# Patient Record
Sex: Male | Born: 1970 | Race: White | Hispanic: No | Marital: Married | Smoking: Current every day smoker
Health system: Southern US, Community
[De-identification: ages and names within clinical notes are randomized; demographics above are authoritative.]

## PROBLEM LIST (undated history)

## (undated) DIAGNOSIS — I10 Essential (primary) hypertension: Secondary | ICD-10-CM

## (undated) DIAGNOSIS — F329 Major depressive disorder, single episode, unspecified: Secondary | ICD-10-CM

## (undated) DIAGNOSIS — E785 Hyperlipidemia, unspecified: Secondary | ICD-10-CM

## (undated) DIAGNOSIS — F32A Depression, unspecified: Secondary | ICD-10-CM

---

## 2013-01-09 ENCOUNTER — Emergency Department (INDEPENDENT_AMBULATORY_CARE_PROVIDER_SITE_OTHER)
Admission: EM | Admit: 2013-01-09 | Discharge: 2013-01-09 | Disposition: A | Discharge status: Home / Self Care | Payer: BC Managed Care – PPO | Source: Home / Self Care | Attending: Family Medicine | Admitting: Family Medicine

## 2013-01-09 ENCOUNTER — Emergency Department (INDEPENDENT_AMBULATORY_CARE_PROVIDER_SITE_OTHER): Payer: BC Managed Care – PPO

## 2013-01-09 ENCOUNTER — Encounter: Payer: Self-pay | Admitting: *Deleted

## 2013-01-09 DIAGNOSIS — R209 Unspecified disturbances of skin sensation: Secondary | ICD-10-CM

## 2013-01-09 MED ORDER — MELOXICAM 15 MG PO TABS: 15.0000 mg | ORAL_TABLET | Freq: Every day | ORAL | Status: DC
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
## 2013-01-19 ENCOUNTER — Encounter: Payer: Self-pay | Admitting: Sports Medicine

## 2013-01-19 ENCOUNTER — Ambulatory Visit (INDEPENDENT_AMBULATORY_CARE_PROVIDER_SITE_OTHER): Payer: BC Managed Care – PPO | Admitting: Sports Medicine

## 2013-01-19 VITALS — BP 168/93 | HR 107 | Wt 170.0 lb

## 2013-01-19 DIAGNOSIS — M5030 Other cervical disc degeneration, unspecified cervical region: Secondary | ICD-10-CM | POA: Insufficient documentation

## 2013-01-19 DIAGNOSIS — S139XXA Sprain of joints and ligaments of unspecified parts of neck, initial encounter: Secondary | ICD-10-CM

## 2013-01-19 DIAGNOSIS — S134XXA Sprain of ligaments of cervical spine, initial encounter: Secondary | ICD-10-CM | POA: Insufficient documentation

## 2013-01-19 MED ORDER — CYCLOBENZAPRINE HCL 10 MG PO TABS: ORAL_TABLET | ORAL | Status: DC

## 2013-01-19 MED ORDER — PREDNISONE 50 MG PO TABS: ORAL_TABLET | ORAL | Status: DC
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
## 2013-01-19 NOTE — Assessment & Plan Note (Signed)
Symptoms are highly classic for left-sided C6 versus a C7 radiculitis. I would like to start conservatively with physical therapy, prednisone, Flexeril, he may use ibuprofen as needed. I would like to see him back in 4 weeks if no better we can certainly get an MRI of the cervical spine.

## 2013-02-02 ENCOUNTER — Ambulatory Visit (INDEPENDENT_AMBULATORY_CARE_PROVIDER_SITE_OTHER): Payer: BC Managed Care – PPO | Admitting: Physical Therapy

## 2013-02-02 DIAGNOSIS — S139XXA Sprain of joints and ligaments of unspecified parts of neck, initial encounter: Secondary | ICD-10-CM

## 2013-02-02 DIAGNOSIS — M542 Cervicalgia: Secondary | ICD-10-CM

## 2013-02-02 DIAGNOSIS — R293 Abnormal posture: Secondary | ICD-10-CM

## 2013-02-03 ENCOUNTER — Ambulatory Visit (INDEPENDENT_AMBULATORY_CARE_PROVIDER_SITE_OTHER): Payer: BC Managed Care – PPO | Admitting: Sports Medicine

## 2013-02-03 ENCOUNTER — Encounter: Payer: Self-pay | Admitting: Sports Medicine

## 2013-02-03 VITALS — BP 170/97 | HR 98 | Wt 177.0 lb

## 2013-02-03 DIAGNOSIS — M5030 Other cervical disc degeneration, unspecified cervical region: Secondary | ICD-10-CM

## 2013-02-03 MED ORDER — PREDNISONE 10 MG PO TABS: ORAL_TABLET | ORAL | Status: DC
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
## 2013-02-03 NOTE — Assessment & Plan Note (Signed)
Was doing much better, but had a flare. C7 radiculitis. I think we will pursue further conservative measures with additional pred taper pack Continue PT. Return middle of April. 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

## 2013-02-06 ENCOUNTER — Encounter (INDEPENDENT_AMBULATORY_CARE_PROVIDER_SITE_OTHER): Payer: BC Managed Care – PPO | Admitting: Physical Therapy

## 2013-02-06 DIAGNOSIS — R293 Abnormal posture: Secondary | ICD-10-CM

## 2013-02-06 DIAGNOSIS — M542 Cervicalgia: Secondary | ICD-10-CM

## 2013-02-06 DIAGNOSIS — S139XXA Sprain of joints and ligaments of unspecified parts of neck, initial encounter: Secondary | ICD-10-CM

## 2013-02-09 ENCOUNTER — Encounter (INDEPENDENT_AMBULATORY_CARE_PROVIDER_SITE_OTHER): Payer: BC Managed Care – PPO | Admitting: Physical Therapy

## 2013-02-09 DIAGNOSIS — S139XXA Sprain of joints and ligaments of unspecified parts of neck, initial encounter: Secondary | ICD-10-CM

## 2013-02-09 DIAGNOSIS — M542 Cervicalgia: Secondary | ICD-10-CM

## 2013-02-09 DIAGNOSIS — R293 Abnormal posture: Secondary | ICD-10-CM

## 2013-02-13 ENCOUNTER — Encounter: Payer: BC Managed Care – PPO | Admitting: Physical Therapy

## 2013-02-16 ENCOUNTER — Ambulatory Visit: Payer: BC Managed Care – PPO | Admitting: Sports Medicine

## 2013-02-16 ENCOUNTER — Encounter (INDEPENDENT_AMBULATORY_CARE_PROVIDER_SITE_OTHER): Payer: BC Managed Care – PPO | Admitting: Physical Therapy

## 2013-02-16 DIAGNOSIS — R293 Abnormal posture: Secondary | ICD-10-CM

## 2013-02-16 DIAGNOSIS — M542 Cervicalgia: Secondary | ICD-10-CM

## 2013-02-16 DIAGNOSIS — S139XXA Sprain of joints and ligaments of unspecified parts of neck, initial encounter: Secondary | ICD-10-CM

## 2013-02-27 ENCOUNTER — Ambulatory Visit (INDEPENDENT_AMBULATORY_CARE_PROVIDER_SITE_OTHER): Payer: BC Managed Care – PPO | Admitting: Sports Medicine

## 2013-02-27 ENCOUNTER — Encounter: Payer: Self-pay | Admitting: Sports Medicine

## 2013-02-27 VITALS — BP 160/99 | HR 94 | Wt 168.0 lb

## 2013-02-27 DIAGNOSIS — M5030 Other cervical disc degeneration, unspecified cervical region: Secondary | ICD-10-CM

## 2013-02-27 MED ORDER — ORPHENADRINE CITRATE ER 100 MG PO TB12: 100.0000 mg | ORAL_TABLET | Freq: Two times a day (BID) | ORAL | Status: DC

## 2013-02-27 NOTE — Progress Notes (Signed)
   Subjective:    CC: Followup  HPI: C7 cervical radiculitis: Kyllian comes back for followup his pain. I placed him on steroids, NSAIDs, muscle relaxers, and placed him in formal physical therapy. His only a little bit better, and the Flexeril is too strong. Pain continues to come down his left arm in a C7 distribution, however he also has some pain now between his shoulder blades. Symptoms are moderate, and stable.  Past medical history, Surgical history, Family history not pertinant except as noted below, Social history, Allergies, and medications have been entered into the medical record, reviewed, and no changes needed.   Review of Systems: No headache, visual changes, nausea, vomiting, diarrhea, constipation, dizziness, abdominal pain, skin rash, fevers, chills, night sweats, weight loss, swollen lymph nodes, body aches, joint swelling, muscle aches, chest pain, shortness of breath, mood changes, visual or auditory hallucinations.   Objective:   General: Well Developed, well nourished, and in no acute distress.  Neuro/Psych: Alert and oriented x3, extra-ocular muscles intact, able to move all 4 extremities, sensation grossly intact. Skin: Warm and dry, no rashes noted.  Respiratory: Not using accessory muscles, speaking in full sentences, trachea midline.  Cardiovascular: Pulses palpable, no extremity edema. Abdomen: Does not appear distended. Neck: Inspection unremarkable. No palpable stepoffs. Negative Spurling's maneuver. Full neck range of motion Grip strength and sensation normal in bilateral hands Strength good C4 to T1 distribution No sensory change to C4 to T1 Negative Hoffman sign bilaterally Reflexes normal Impression and Recommendations:   This case required medical decision making of moderate complexity.

## 2013-02-27 NOTE — Assessment & Plan Note (Signed)
Status post physical therapy, he does have a C7 radiculitis. Pain is now predominantly between the shoulder blades. Status post oral steroids and NSAIDs. Flexeril was too strong, changing to Norflex. MRI of the cervical spine, and he will come back to see me to go over results.

## 2013-03-02 ENCOUNTER — Telehealth: Payer: Self-pay | Admitting: *Deleted

## 2013-03-02 NOTE — Telephone Encounter (Signed)
Spoke with [facility] and obtained prior auth for MRI Cervical spine w/o contrast.  Auth# [id number] good til 03/31/2013. Spoke with Carollee Herter at [facility] in [place] and gave authorization #. Barry Dienes, LPN

## 2013-03-03 ENCOUNTER — Ambulatory Visit (HOSPITAL_BASED_OUTPATIENT_CLINIC_OR_DEPARTMENT_OTHER)
Admission: RE | Admit: 2013-03-03 | Discharge: 2013-03-03 | Disposition: A | Discharge status: Home / Self Care | Payer: BC Managed Care – PPO | Source: Ambulatory Visit | Attending: Sports Medicine | Admitting: Sports Medicine

## 2013-03-03 ENCOUNTER — Ambulatory Visit (HOSPITAL_BASED_OUTPATIENT_CLINIC_OR_DEPARTMENT_OTHER): Payer: BC Managed Care – PPO

## 2013-03-03 DIAGNOSIS — R29898 Other symptoms and signs involving the musculoskeletal system: Secondary | ICD-10-CM | POA: Insufficient documentation

## 2013-03-03 DIAGNOSIS — M5380 Other specified dorsopathies, site unspecified: Secondary | ICD-10-CM | POA: Insufficient documentation

## 2013-03-03 DIAGNOSIS — M542 Cervicalgia: Secondary | ICD-10-CM | POA: Insufficient documentation

## 2013-03-03 DIAGNOSIS — M5030 Other cervical disc degeneration, unspecified cervical region: Secondary | ICD-10-CM

## 2013-03-03 DIAGNOSIS — R209 Unspecified disturbances of skin sensation: Secondary | ICD-10-CM | POA: Insufficient documentation

## 2013-03-05 ENCOUNTER — Ambulatory Visit (INDEPENDENT_AMBULATORY_CARE_PROVIDER_SITE_OTHER): Payer: BC Managed Care – PPO | Admitting: Sports Medicine

## 2013-03-05 VITALS — BP 154/91 | HR 115 | Temp 98.6°F | Resp 16 | Wt 170.0 lb

## 2013-03-05 DIAGNOSIS — S134XXD Sprain of ligaments of cervical spine, subsequent encounter: Secondary | ICD-10-CM

## 2013-03-05 DIAGNOSIS — M5030 Other cervical disc degeneration, unspecified cervical region: Secondary | ICD-10-CM
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
## 2013-03-05 NOTE — Assessment & Plan Note (Signed)
On my review of the MRI there is multilevel degenerative disc disease, worse at the C4-C5 level with a large right-sided disc protrusion. There is also degenerative disease at the C5-C6 level with a broad-based disc protrusion causing bilateral foraminal stenosis. Lastly, there is disc protrusion at the C6-C7 level, broad-based causing bilateral foraminal stenosis, C7-T1 level is clear. Marcus Lee has already been through physical therapy, steroids, muscle relaxers, NSAIDs. At this point we are going to proceed with interventional treatment, it will be a C6-C7 left-sided interlaminar epidural steroid injection. He'll come back to see me after the injection to go over response.
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
## 2013-03-26 ENCOUNTER — Ambulatory Visit
Admission: RE | Admit: 2013-03-26 | Discharge: 2013-03-26 | Disposition: A | Discharge status: Home / Self Care | Payer: BC Managed Care – PPO | Source: Ambulatory Visit | Attending: Sports Medicine | Admitting: Sports Medicine

## 2013-03-26 VITALS — BP 166/90 | HR 90

## 2013-03-26 DIAGNOSIS — M5030 Other cervical disc degeneration, unspecified cervical region: Secondary | ICD-10-CM

## 2013-03-26 MED ORDER — TRIAMCINOLONE ACETONIDE 40 MG/ML IJ SUSP (RADIOLOGY)
60.0000 mg | Freq: Once | INTRAMUSCULAR | Status: AC
  Administered 2013-03-26: 60 mg via EPIDURAL

## 2013-03-26 MED ORDER — IOHEXOL 300 MG/ML  SOLN
1.0000 mL | Freq: Once | INTRAMUSCULAR | Status: AC | PRN
  Administered 2013-03-26: 1 mL via EPIDURAL

## 2013-04-02 ENCOUNTER — Ambulatory Visit (INDEPENDENT_AMBULATORY_CARE_PROVIDER_SITE_OTHER): Payer: BC Managed Care – PPO | Admitting: Sports Medicine

## 2013-04-02 VITALS — BP 157/102 | HR 101

## 2013-04-02 DIAGNOSIS — I10 Essential (primary) hypertension: Secondary | ICD-10-CM

## 2013-04-02 DIAGNOSIS — Z299 Encounter for prophylactic measures, unspecified: Secondary | ICD-10-CM | POA: Insufficient documentation

## 2013-04-02 DIAGNOSIS — M5030 Other cervical disc degeneration, unspecified cervical region: Secondary | ICD-10-CM

## 2013-04-02 MED ORDER — LISINOPRIL-HYDROCHLOROTHIAZIDE 10-12.5 MG PO TABS: 1.0000 | ORAL_TABLET | Freq: Every day | ORAL | Status: DC

## 2013-04-02 NOTE — Assessment & Plan Note (Signed)
Lisinopril/hydrochlorothiazide 12.5/20. Return in 2 weeks for blood pressure check.

## 2013-04-02 NOTE — Assessment & Plan Note (Signed)
Checking routine bloodwork. 

## 2013-04-02 NOTE — Progress Notes (Signed)
  Subjective:    CC: Followup  HPI: C7 radiculitis: Ameya had his interlaminar epidural injection, returns today more than 90% improved, and a single ibuprofen results his pain completely. He is very happy with the results.  Elevated blood pressure: Does not have a history of hypertension, but has been told he has high blood pressure and several occasions, all of his family members have hypertension. Denies visual changes, headaches, chest pain, short of breath, lower extremity swelling, desires to start a medication rather than do lifestyle modification.  Past medical history, Surgical history, Family history not pertinant except as noted below, Social history, Allergies, and medications have been entered into the medical record, reviewed, and no changes needed.   Review of Systems: No fevers, chills, night sweats, weight loss, chest pain, or shortness of breath.   Objective:    General: Well Developed, well nourished, and in no acute distress.  Neuro: Alert and oriented x3, extra-ocular muscles intact, sensation grossly intact.  HEENT: Normocephalic, atraumatic, pupils equal round reactive to light, neck supple, no masses, no lymphadenopathy, thyroid nonpalpable.  Skin: Warm and dry, no rashes. Cardiac: Regular rate and rhythm, no murmurs rubs or gallops, no lower extremity edema.  Respiratory: Clear to auscultation bilaterally. Not using accessory muscles, speaking in full sentences. Neck: Inspection unremarkable. No palpable stepoffs. Negative Spurling's maneuver. Full neck range of motion Grip strength and sensation normal in bilateral hands Strength good C4 to T1 distribution No sensory change to C4 to T1 Negative Hoffman sign bilaterally Reflexes normal Impression and Recommendations:

## 2013-04-02 NOTE — Assessment & Plan Note (Signed)
90+ percent pain relief with a single interlaminar epidural steroid injection for left C7 radiculitis. Continue home exercises, and occasional Mobic. Return as needed for this. Review of 3 injections in  a six-month period.

## 2013-04-07 LAB — TSH: TSH: 1.687 u[IU]/mL (ref 0.350–4.500)

## 2013-04-07 LAB — COMPREHENSIVE METABOLIC PANEL WITH GFR
ALT: 19 U/L (ref 0–53)
BUN: 20 mg/dL (ref 6–23)
CO2: 25 meq/L (ref 19–32)
Calcium: 10.3 mg/dL (ref 8.4–10.5)
Creat: 0.72 mg/dL (ref 0.50–1.35)
Glucose, Bld: 91 mg/dL (ref 70–99)
Total Bilirubin: 0.6 mg/dL (ref 0.3–1.2)

## 2013-04-07 LAB — CBC
HCT: 41 % (ref 39.0–52.0)
Hemoglobin: 14.5 g/dL (ref 13.0–17.0)
MCH: 33.6 pg (ref 26.0–34.0)
MCHC: 35.4 g/dL (ref 30.0–36.0)
MCV: 94.9 fL (ref 78.0–100.0)
Platelets: 303 K/uL (ref 150–400)
RBC: 4.32 MIL/uL (ref 4.22–5.81)
RDW: 13.6 % (ref 11.5–15.5)
WBC: 6.9 10*3/uL (ref 4.0–10.5)

## 2013-04-07 LAB — LIPID PANEL
Cholesterol: 259 mg/dL — ABNORMAL HIGH (ref 0–200)
HDL: 80 mg/dL (ref >39)
LDL Cholesterol: 166 mg/dL — ABNORMAL HIGH (ref 0–99)
Total CHOL/HDL Ratio: 3.2 ratio
Triglycerides: 65 mg/dL (ref <150)
VLDL: 13 mg/dL (ref 0–40)

## 2013-04-07 LAB — COMPREHENSIVE METABOLIC PANEL
AST: 17 U/L (ref 0–37)
Albumin: 5.1 g/dL (ref 3.5–5.2)
Alkaline Phosphatase: 62 U/L (ref 39–117)
Chloride: 96 mEq/L (ref 96–112)
Potassium: 4.3 mEq/L (ref 3.5–5.3)
Sodium: 134 mEq/L — ABNORMAL LOW (ref 135–145)
Total Protein: 7.5 g/dL (ref 6.0–8.3)

## 2013-04-08 ENCOUNTER — Encounter: Payer: Self-pay | Admitting: Sports Medicine

## 2013-04-08 DIAGNOSIS — E291 Testicular hypofunction: Secondary | ICD-10-CM | POA: Insufficient documentation

## 2013-04-08 DIAGNOSIS — E785 Hyperlipidemia, unspecified: Secondary | ICD-10-CM | POA: Insufficient documentation

## 2013-04-08 LAB — TESTOSTERONE, FREE, TOTAL, SHBG
Sex Hormone Binding: 34 nmol/L (ref 13–71)
Testosterone, Free: 34.9 pg/mL — ABNORMAL LOW (ref 47.0–244.0)
Testosterone-% Free: 1.9 % (ref 1.6–2.9)
Testosterone: 188 ng/dL — ABNORMAL LOW (ref 300–890)

## 2013-04-08 LAB — VITAMIN D 25 HYDROXY (VIT D DEFICIENCY, FRACTURES): Vit D, 25-Hydroxy: 63 ng/mL (ref 30–89)

## 2013-04-09 MED ORDER — ATORVASTATIN CALCIUM 40 MG PO TABS: 40.0000 mg | ORAL_TABLET | Freq: Every day | ORAL | Status: DC

## 2013-04-09 NOTE — Addendum Note (Signed)
Addended by: Monica Becton on: 04/09/2013 09:37 AM   Modules accepted: Orders

## 2013-04-15 ENCOUNTER — Ambulatory Visit (INDEPENDENT_AMBULATORY_CARE_PROVIDER_SITE_OTHER): Payer: BC Managed Care – PPO | Admitting: Sports Medicine

## 2013-04-15 VITALS — BP 131/85 | HR 108

## 2013-04-15 DIAGNOSIS — I10 Essential (primary) hypertension: Secondary | ICD-10-CM

## 2013-04-15 DIAGNOSIS — E291 Testicular hypofunction: Secondary | ICD-10-CM

## 2013-04-15 DIAGNOSIS — M5030 Other cervical disc degeneration, unspecified cervical region: Secondary | ICD-10-CM

## 2013-04-15 MED ORDER — LISINOPRIL-HYDROCHLOROTHIAZIDE 20-25 MG PO TABS: 1.0000 | ORAL_TABLET | Freq: Every day | ORAL | Status: DC

## 2013-04-15 MED ORDER — TESTOSTERONE 40.5 MG/2.5GM (1.62%) TD GEL: 1.0000 "application " | Freq: Every day | TRANSDERMAL | Status: DC

## 2013-04-15 NOTE — Assessment & Plan Note (Signed)
Starting testosterone supplementation. Recheck in one month.

## 2013-04-15 NOTE — Assessment & Plan Note (Signed)
Improved, increasing blood pressure medicine to 20/25 mg.

## 2013-04-15 NOTE — Progress Notes (Signed)
  Subjective:    CC: Followup  HPI: Hypertension: Better controlled with lisinopril/hydrochlorothiazide.  Hyperlipidemia: No problems with statin.  Cervical degenerative disc disease: Continue to be pain-free after single epidural injection.  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 Like to try supplementation.  Past medical history, Surgical history, Family history not pertinant except as noted below, Social history, Allergies, and medications have been entered into the medical record, reviewed, and no changes needed.   Review of Systems: No fevers, chills, night sweats, weight loss, chest pain, or shortness of breath.   Objective:    General: Well Developed, well nourished, and in no acute distress.  Neuro: Alert and oriented x3, extra-ocular muscles intact, sensation grossly intact.  HEENT: Normocephalic, atraumatic, pupils equal round reactive to light, neck supple, no masses, no lymphadenopathy, thyroid nonpalpable.  Skin: Warm and dry, no rashes. Cardiac: Regular rate and rhythm, no murmurs rubs or gallops, no lower extremity edema.  Respiratory: Clear to auscultation bilaterally. Not using accessory muscles, speaking in full sentences. Impression and Recommendations:

## 2013-04-15 NOTE — Assessment & Plan Note (Signed)
Can use to be pain-free after cervical epidural.

## 2013-04-17 ENCOUNTER — Ambulatory Visit: Payer: BC Managed Care – PPO | Admitting: Sports Medicine

## 2013-04-21 ENCOUNTER — Telehealth: Payer: Self-pay | Admitting: *Deleted

## 2013-04-21 NOTE — Telephone Encounter (Signed)
PA obtained for Androgel and approved. Contacted [facility] [address] to inform.  Meyer Cory, LPN

## 2013-05-15 ENCOUNTER — Encounter: Payer: Self-pay | Admitting: Sports Medicine

## 2013-05-15 ENCOUNTER — Ambulatory Visit (INDEPENDENT_AMBULATORY_CARE_PROVIDER_SITE_OTHER): Payer: BC Managed Care – PPO | Admitting: Sports Medicine

## 2013-05-15 VITALS — BP 138/82 | HR 99 | Wt 160.0 lb

## 2013-05-15 DIAGNOSIS — E291 Testicular hypofunction: Secondary | ICD-10-CM

## 2013-05-15 DIAGNOSIS — I10 Essential (primary) hypertension: Secondary | ICD-10-CM

## 2013-05-15 DIAGNOSIS — M5030 Other cervical disc degeneration, unspecified cervical region: Secondary | ICD-10-CM

## 2013-05-15 NOTE — Assessment & Plan Note (Signed)
Blood pressures well-controlled. No changes 

## 2013-05-15 NOTE — Assessment & Plan Note (Signed)
Rechecking testosterone. We will augment dose as needed.

## 2013-05-15 NOTE — Assessment & Plan Note (Signed)
Pain continues to be resolved after epidural.

## 2013-05-15 NOTE — Progress Notes (Signed)
  Subjective:    CC: Followup  HPI: Hypertension: Under adequate control, no adverse effects, no chest pain, lower extremity swelling.  Male hypogonadism:  doing one application on AndroGel daily, feels much better. Due for recheck in testosterone.  Cervical radiculitis/degenerative disc disease: Resolved after a single interlaminar epidural steroid injection.  Past medical history, Surgical history, Family history not pertinant except as noted below, Social history, Allergies, and medications have been entered into the medical record, reviewed, and no changes needed.   Review of Systems: No fevers, chills, night sweats, weight loss, chest pain, or shortness of breath.   Objective:    General: Well Developed, well nourished, and in no acute distress.  Neuro: Alert and oriented x3, extra-ocular muscles intact, sensation grossly intact.  HEENT: Normocephalic, atraumatic, pupils equal round reactive to light, neck supple, no masses, no lymphadenopathy, thyroid nonpalpable.  Skin: Warm and dry, no rashes. Cardiac: Regular rate and rhythm, no murmurs rubs or gallops, no lower extremity edema.  Respiratory: Clear to auscultation bilaterally. Not using accessory muscles, speaking in full sentences. Neck: Inspection unremarkable. No palpable stepoffs. Negative Spurling's maneuver. Full neck range of motion Grip strength and sensation normal in bilateral hands Strength good C4 to T1 distribution No sensory change to C4 to T1 Negative Hoffman sign bilaterally Reflexes normal Impression and Recommendations:

## 2013-05-18 LAB — TESTOSTERONE, FREE, TOTAL, SHBG
Sex Hormone Binding: 66 nmol/L (ref 13–71)
Testosterone, Free: 62.1 pg/mL (ref 47.0–244.0)
Testosterone-% Free: 1.3 % — ABNORMAL LOW (ref 1.6–2.9)
Testosterone: 483 ng/dL (ref 300–890)

## 2013-06-05 ENCOUNTER — Encounter: Payer: Self-pay | Admitting: Sports Medicine

## 2013-06-05 ENCOUNTER — Ambulatory Visit (INDEPENDENT_AMBULATORY_CARE_PROVIDER_SITE_OTHER): Payer: BC Managed Care – PPO | Admitting: Sports Medicine

## 2013-06-05 VITALS — BP 128/80 | HR 96 | Wt 160.0 lb

## 2013-06-05 DIAGNOSIS — I10 Essential (primary) hypertension: Secondary | ICD-10-CM

## 2013-06-05 DIAGNOSIS — E785 Hyperlipidemia, unspecified: Secondary | ICD-10-CM

## 2013-06-05 DIAGNOSIS — E291 Testicular hypofunction: Secondary | ICD-10-CM

## 2013-06-05 DIAGNOSIS — M5030 Other cervical disc degeneration, unspecified cervical region: Secondary | ICD-10-CM

## 2013-06-05 DIAGNOSIS — L259 Unspecified contact dermatitis, unspecified cause: Secondary | ICD-10-CM

## 2013-06-05 MED ORDER — LISINOPRIL-HYDROCHLOROTHIAZIDE 20-25 MG PO TABS: 1.0000 | ORAL_TABLET | Freq: Every day | ORAL | Status: DC

## 2013-06-05 MED ORDER — ATORVASTATIN CALCIUM 40 MG PO TABS: 40.0000 mg | ORAL_TABLET | Freq: Every day | ORAL | Status: DC

## 2013-06-05 MED ORDER — TRIAMCINOLONE ACETONIDE 0.5 % EX CREA: TOPICAL_CREAM | Freq: Two times a day (BID) | CUTANEOUS | Status: DC

## 2013-06-05 MED ORDER — TESTOSTERONE 40.5 MG/2.5GM (1.62%) TD GEL: 2.0000 "application " | Freq: Every day | TRANSDERMAL | Status: DC

## 2013-06-05 NOTE — Assessment & Plan Note (Signed)
Well controlled. Refilling blood pressure medication, no changes.

## 2013-06-05 NOTE — Progress Notes (Signed)
  Subjective:    CC: Followup  HPI: Hypertension: Well controlled  Hyperlipidemia: Stable on Lipitor   Cervical degenerative disc disease: Continues to do well after a C7-T1 interlaminar epidural approximately 3 months ago, only has a little bit of pain now which is resolved with simple ibuprofen.   Rash on leg: Present for a few days, left thigh, intensely pruritic.  Past medical history, Surgical history, Family history not pertinant except as noted below, Social history, Allergies, and medications have been entered into the medical record, reviewed, and no changes needed.   Review of Systems: No fevers, chills, night sweats, weight loss, chest pain, or shortness of breath.   Objective:    General: Well Developed, well nourished, and in no acute distress.  Neuro: Alert and oriented x3, extra-ocular muscles intact, sensation grossly intact.  HEENT: Normocephalic, atraumatic, pupils equal round reactive to light, neck supple, no masses, no lymphadenopathy, thyroid nonpalpable.  Skin: Warm and dry, no rashes. there is what appears to be a plaque of contact dermatitis on his left inner thigh.  Cardiac: Regular rate and rhythm, no murmurs rubs or gallops, no lower extremity edema.  Respiratory: Clear to auscultation bilaterally. Not using accessory muscles, speaking in full sentences.  Impression and Recommendations:

## 2013-06-05 NOTE — Assessment & Plan Note (Signed)
A little the pain is coming back after his C7-T1 interlaminar epidural. This was 3 months ago. He will continue ibuprofen, let me know when he is ready to try a repeat epidural. We can safely do 3 injections every 6 months.

## 2013-06-05 NOTE — Assessment & Plan Note (Signed)
Kenalog 0.5% twice a day.

## 2013-06-05 NOTE — Assessment & Plan Note (Signed)
Continue Lipitor, no changes. Refilling.

## 2013-06-05 NOTE — Assessment & Plan Note (Signed)
Symptoms are improved. We are going to double testosterone to 2 applications daily. I would like to recheck levels in 4 weeks to ensure that we are not getting supratherapeutic.

## 2013-08-21 ENCOUNTER — Ambulatory Visit (INDEPENDENT_AMBULATORY_CARE_PROVIDER_SITE_OTHER): Payer: BC Managed Care – PPO | Admitting: Sports Medicine

## 2013-08-21 ENCOUNTER — Encounter: Payer: Self-pay | Admitting: Sports Medicine

## 2013-08-21 VITALS — BP 131/71 | HR 115 | Wt 164.0 lb

## 2013-08-21 DIAGNOSIS — E785 Hyperlipidemia, unspecified: Secondary | ICD-10-CM

## 2013-08-21 DIAGNOSIS — L259 Unspecified contact dermatitis, unspecified cause: Secondary | ICD-10-CM

## 2013-08-21 DIAGNOSIS — E291 Testicular hypofunction: Secondary | ICD-10-CM

## 2013-08-21 DIAGNOSIS — I10 Essential (primary) hypertension: Secondary | ICD-10-CM

## 2013-08-21 DIAGNOSIS — F17200 Nicotine dependence, unspecified, uncomplicated: Secondary | ICD-10-CM | POA: Insufficient documentation

## 2013-08-21 DIAGNOSIS — M5030 Other cervical disc degeneration, unspecified cervical region: Secondary | ICD-10-CM

## 2013-08-21 MED ORDER — TESTOSTERONE 40.5 MG/2.5GM (1.62%) TD GEL: 2.0000 "application " | Freq: Every day | TRANSDERMAL | Status: DC

## 2013-08-21 MED ORDER — ATORVASTATIN CALCIUM 40 MG PO TABS: 40.0000 mg | ORAL_TABLET | Freq: Every day | ORAL | Status: DC

## 2013-08-21 MED ORDER — VARENICLINE TARTRATE 0.5 MG X 11 & 1 MG X 42 PO MISC: ORAL | Status: DC

## 2013-08-21 MED ORDER — VARENICLINE TARTRATE 1 MG PO TABS: 1.0000 mg | ORAL_TABLET | Freq: Two times a day (BID) | ORAL | Status: DC

## 2013-08-21 NOTE — Assessment & Plan Note (Signed)
Well-controlled, no changes to lisinopril/hydrochlorothiazide.

## 2013-08-21 NOTE — Assessment & Plan Note (Signed)
Continue Lipitor, refilling.

## 2013-08-21 NOTE — Progress Notes (Signed)
  Subjective:    CC: Followup  HPI: Cervical degenerative disc disease: Continues to be resolved after epidural.  Hypertension: Well controlled.  Hyperlipidemia: Stable on Lipitor.  Smoker: Desires to start Chantix.  Hypogonadism:  Ran out of testosterone sometime ago, he did note improvement going from 1-2 applications daily. Needs a refill.  Past medical history, Surgical history, Family history not pertinant except as noted below, Social history, Allergies, and medications have been entered into the medical record, reviewed, and no changes needed.   Review of Systems: No fevers, chills, night sweats, weight loss, chest pain, or shortness of breath.   Objective:    General: Well Developed, well nourished, and in no acute distress.  Neuro: Alert and oriented x3, extra-ocular muscles intact, sensation grossly intact.  HEENT: Normocephalic, atraumatic, pupils equal round reactive to light, neck supple, no masses, no lymphadenopathy, thyroid nonpalpable.  Skin: Warm and dry, no rashes. Cardiac: Regular rate and rhythm, no murmurs rubs or gallops, no lower extremity edema.  Respiratory: Clear to auscultation bilaterally. Not using accessory muscles, speaking in full sentences.  Impression and Recommendations:

## 2013-08-21 NOTE — Assessment & Plan Note (Signed)
Continues to do well after epidural. 

## 2013-08-21 NOTE — Assessment & Plan Note (Signed)
Ran out of testosterone. Refilling, we can recheck this along with a CBC in 4 weeks.

## 2013-08-21 NOTE — Assessment & Plan Note (Signed)
Resolved with Kenalog cream.

## 2013-08-21 NOTE — Assessment & Plan Note (Signed)
Desires to quit. Starting Chantix.

## 2013-08-26 ENCOUNTER — Telehealth: Payer: Self-pay | Admitting: *Deleted

## 2013-08-26 DIAGNOSIS — F17200 Nicotine dependence, unspecified, uncomplicated: Secondary | ICD-10-CM

## 2013-08-26 MED ORDER — VARENICLINE TARTRATE 1 MG PO TABS: 1.0000 mg | ORAL_TABLET | Freq: Two times a day (BID) | ORAL | Status: DC

## 2013-08-26 NOTE — Telephone Encounter (Signed)
[facility] states pt needs [facility] for [facility] and after via web [facility] and speaking with someone at [employer] for pt's insurance they say pt doesn't need PA. Dr. Benjamin Stain is going to reprint rx for pt to take to different pharmacy [facility] of [facility].  Meyer Cory, LPN

## 2013-12-29 ENCOUNTER — Other Ambulatory Visit: Payer: Self-pay | Admitting: Sports Medicine

## 2013-12-29 MED ORDER — TESTOSTERONE 40.5 MG/2.5GM (1.62%) TD GEL: 2.0000 "application " | Freq: Every day | TRANSDERMAL | Status: DC

## 2013-12-30 ENCOUNTER — Other Ambulatory Visit: Payer: Self-pay | Admitting: Sports Medicine

## 2013-12-30 ENCOUNTER — Telehealth: Payer: Self-pay | Admitting: *Deleted

## 2013-12-30 DIAGNOSIS — E785 Hyperlipidemia, unspecified: Secondary | ICD-10-CM

## 2013-12-30 DIAGNOSIS — E291 Testicular hypofunction: Secondary | ICD-10-CM

## 2014-01-01 ENCOUNTER — Encounter: Payer: Self-pay | Admitting: Sports Medicine

## 2014-01-01 ENCOUNTER — Ambulatory Visit (INDEPENDENT_AMBULATORY_CARE_PROVIDER_SITE_OTHER): Payer: BC Managed Care – PPO | Admitting: Sports Medicine

## 2014-01-01 VITALS — BP 148/90 | HR 83 | Ht 69.0 in | Wt 179.0 lb

## 2014-01-01 DIAGNOSIS — M5030 Other cervical disc degeneration, unspecified cervical region: Secondary | ICD-10-CM

## 2014-01-01 DIAGNOSIS — E291 Testicular hypofunction: Secondary | ICD-10-CM

## 2014-01-01 DIAGNOSIS — F17200 Nicotine dependence, unspecified, uncomplicated: Secondary | ICD-10-CM

## 2014-01-01 DIAGNOSIS — I10 Essential (primary) hypertension: Secondary | ICD-10-CM

## 2014-01-01 DIAGNOSIS — E785 Hyperlipidemia, unspecified: Secondary | ICD-10-CM

## 2014-01-01 MED ORDER — LISINOPRIL-HYDROCHLOROTHIAZIDE 20-25 MG PO TABS: 1.0000 | ORAL_TABLET | Freq: Every day | ORAL | Status: DC

## 2014-01-01 NOTE — Assessment & Plan Note (Signed)
Continues to do well after epidural injection.

## 2014-01-01 NOTE — Assessment & Plan Note (Signed)
Has been on blood pressure medicine for several days. Refilling. It has been well controlled when he takes the medicine.

## 2014-01-01 NOTE — Assessment & Plan Note (Signed)
12 weeks now without a cigarette after Chantix. He does use E. cigarettes.

## 2014-01-01 NOTE — Assessment & Plan Note (Signed)
AndroGel has become too expensive. Switching to testosterone injections, return in one month to start injections.

## 2014-01-01 NOTE — Progress Notes (Signed)
  Subjective:    CC: Followup  HPI: Male hypogonadism : AndroGel has now become too expensive, he's inquiring about alternatives.  Smoker: It is now 12 weeks smoking cessation after using Chantix. Congratulated.  Hypertension: Has been out of his medication now for several days, blood pressure is elevated. No headaches, visual changes, chest pain.  Cervical degenerative disc disease: Continue to be resolved after epidural year ago.  Past medical history, Surgical history, Family history not pertinant except as noted below, Social history, Allergies, and medications have been entered into the medical record, reviewed, and no changes needed.   Review of Systems: No fevers, chills, night sweats, weight loss, chest pain, or shortness of breath.   Objective:    General: Well Developed, well nourished, and in no acute distress.  Neuro: Alert and oriented x3, extra-ocular muscles intact, sensation grossly intact.  HEENT: Normocephalic, atraumatic, pupils equal round reactive to light, neck supple, no masses, no lymphadenopathy, thyroid nonpalpable.  Skin: Warm and dry, no rashes. Cardiac: Regular rate and rhythm, no murmurs rubs or gallops, no lower extremity edema.  Respiratory: Clear to auscultation bilaterally. Not using accessory muscles, speaking in full sentences.  Impression and Recommendations:

## 2014-01-01 NOTE — Assessment & Plan Note (Signed)
Continue statin, no changes.

## 2014-01-15 MED ORDER — ATORVASTATIN CALCIUM 40 MG PO TABS: 40.0000 mg | ORAL_TABLET | Freq: Every day | ORAL | Status: DC

## 2014-01-15 NOTE — Telephone Encounter (Signed)
Patient walk in office 1:38pm request to know if he can have med refill for Lipitor and lisinopril. Pt states he is going out of town for 3 weeks and will be out before then. [facility] [address]. If any concerns ort not able to refill pt request a call back [phone].

## 2014-03-11 ENCOUNTER — Encounter: Payer: Self-pay | Admitting: Sports Medicine

## 2014-03-11 ENCOUNTER — Ambulatory Visit (INDEPENDENT_AMBULATORY_CARE_PROVIDER_SITE_OTHER): Payer: BC Managed Care – PPO | Admitting: Sports Medicine

## 2014-03-11 VITALS — BP 132/82 | HR 105 | Ht 69.0 in | Wt 176.0 lb

## 2014-03-11 DIAGNOSIS — E291 Testicular hypofunction: Secondary | ICD-10-CM

## 2014-03-11 MED ORDER — TESTOSTERONE CYPIONATE 200 MG/ML IM SOLN
200.0000 mg | INTRAMUSCULAR | Status: DC
  Administered 2014-03-11: 200 mg via INTRAMUSCULAR

## 2014-03-11 NOTE — Assessment & Plan Note (Signed)
Androgel is too expensive, we will start intramuscular testosterone injections. Injection 200 mg of testosterone given today here in the office, he will return every 2 weeks, once we have established a good level we can transition him to home shots. I would like to recheck testosterone in approximately 6 weeks, exactly one week after his injection.

## 2014-03-11 NOTE — Progress Notes (Signed)
  Subjective:    CC: Male hypogonadism  HPI: This very pleasant 43 year-old male comes back, he has now come off of his AndroGel, this was too expensive. He desires to switch to every 2 week testosterone injections. We will be giving him his first injection today. He has noted worsening fatigue, low libido.  Past medical history, Surgical history, Family history not pertinant except as noted below, Social history, Allergies, and medications have been entered into the medical record, reviewed, and no changes needed.   Review of Systems: No fevers, chills, night sweats, weight loss, chest pain, or shortness of breath.   Objective:    General: Well Developed, well nourished, and in no acute distress.  Neuro: Alert and oriented x3, extra-ocular muscles intact, sensation grossly intact.  HEENT: Normocephalic, atraumatic, pupils equal round reactive to light, neck supple, no masses, no lymphadenopathy, thyroid nonpalpable.  Skin: Warm and dry, no rashes. Cardiac: Regular rate and rhythm, no murmurs rubs or gallops, no lower extremity edema.  Respiratory: Clear to auscultation bilaterally. Not using accessory muscles, speaking in full sentences.  Impression and Recommendations:

## 2014-03-25 ENCOUNTER — Ambulatory Visit: Payer: BC Managed Care – PPO | Admitting: *Deleted

## 2014-03-29 ENCOUNTER — Encounter: Payer: Self-pay | Admitting: *Deleted

## 2014-03-29 ENCOUNTER — Ambulatory Visit (INDEPENDENT_AMBULATORY_CARE_PROVIDER_SITE_OTHER): Payer: BC Managed Care – PPO | Admitting: Sports Medicine

## 2014-03-29 VITALS — BP 127/75 | HR 103 | Wt 177.0 lb

## 2014-03-29 DIAGNOSIS — E291 Testicular hypofunction: Secondary | ICD-10-CM

## 2014-03-29 MED ORDER — TESTOSTERONE CYPIONATE 200 MG/ML IM SOLN
200.0000 mg | Freq: Once | INTRAMUSCULAR | Status: AC
  Administered 2014-03-29: 200 mg via INTRAMUSCULAR

## 2014-03-29 NOTE — Progress Notes (Signed)
   Subjective:    Patient ID: Marcus ManorBrian Lee, male    DOB: 1971-03-10, 43 y.o.   MRN: [id number] Testosterone given IM LUOQ. Donne AnonAmber Melanee Cordial, CMA. HPI    Review of Systems     Objective:   Physical Exam        Assessment & Plan:

## 2014-03-29 NOTE — Assessment & Plan Note (Signed)
Testosterone injection as above. 

## 2014-04-09 ENCOUNTER — Ambulatory Visit (INDEPENDENT_AMBULATORY_CARE_PROVIDER_SITE_OTHER): Payer: BC Managed Care – PPO | Admitting: Sports Medicine

## 2014-04-09 VITALS — BP 121/72 | HR 83 | Wt 181.0 lb

## 2014-04-09 DIAGNOSIS — E291 Testicular hypofunction: Secondary | ICD-10-CM

## 2014-04-09 MED ORDER — TESTOSTERONE CYPIONATE 200 MG/ML IM SOLN
200.0000 mg | Freq: Once | INTRAMUSCULAR | Status: AC
  Administered 2014-04-09: 200 mg via INTRAMUSCULAR

## 2014-04-09 NOTE — Assessment & Plan Note (Signed)
Testosterone injection as above. 

## 2014-04-09 NOTE — Progress Notes (Signed)
   Subjective:    Patient ID: Marcus Lee, male    DOB: 02/28/1971, 43 y.o.   MRN: [id number]  HPI  Marcus Lee is here for testosterone injection. He denies chest pain, shortness of breath, headaches and mood changes.   Review of Systems     Objective:   Physical Exam        Assessment & Plan:  Patient tolerated injection well. Advised patient to schedule next visit before he leaves. He will have labs drawn after his next injection.

## 2014-04-23 ENCOUNTER — Ambulatory Visit (INDEPENDENT_AMBULATORY_CARE_PROVIDER_SITE_OTHER): Payer: BC Managed Care – PPO | Admitting: Sports Medicine

## 2014-04-23 ENCOUNTER — Encounter: Payer: Self-pay | Admitting: *Deleted

## 2014-04-23 VITALS — BP 125/71 | HR 114 | Wt 176.0 lb

## 2014-04-23 DIAGNOSIS — E291 Testicular hypofunction: Secondary | ICD-10-CM

## 2014-04-23 MED ORDER — TESTOSTERONE CYPIONATE 200 MG/ML IM SOLN
200.0000 mg | Freq: Once | INTRAMUSCULAR | Status: AC
  Administered 2014-04-23: 200 mg via INTRAMUSCULAR

## 2014-04-23 NOTE — Progress Notes (Signed)
Pt here for testosterone injection no SOB,CP or mood swings. Injection tolerated well given in LUOQ.Alpha Mysliwiec L Camay Pedigo  

## 2014-04-23 NOTE — Assessment & Plan Note (Signed)
Testosterone injection as above. 

## 2014-04-26 LAB — TESTOSTERONE: Testosterone: 1358.88 ng/dL — ABNORMAL HIGH (ref 300–890)

## 2014-05-07 ENCOUNTER — Ambulatory Visit (INDEPENDENT_AMBULATORY_CARE_PROVIDER_SITE_OTHER): Payer: BC Managed Care – PPO | Admitting: Sports Medicine

## 2014-05-07 ENCOUNTER — Encounter: Payer: Self-pay | Admitting: *Deleted

## 2014-05-07 VITALS — BP 109/62 | HR 97

## 2014-05-07 DIAGNOSIS — E291 Testicular hypofunction: Secondary | ICD-10-CM

## 2014-05-07 MED ORDER — TESTOSTERONE CYPIONATE 200 MG/ML IM SOLN
200.0000 mg | Freq: Once | INTRAMUSCULAR | Status: AC
  Administered 2014-05-07: 200 mg via INTRAMUSCULAR

## 2014-05-07 NOTE — Progress Notes (Signed)
   Subjective:    Patient ID: Marcus Lee, male    DOB: 12/19/1970, 43 y.o.   MRN: [id number] Testosterone given IM RUOQ.  No complications. Donne AnonAmber Stephan Nelis, CMA HPI    Review of Systems     Objective:   Physical Exam        Assessment & Plan:

## 2014-05-07 NOTE — Assessment & Plan Note (Signed)
Testosterone injection as above. 

## 2014-05-20 ENCOUNTER — Ambulatory Visit (INDEPENDENT_AMBULATORY_CARE_PROVIDER_SITE_OTHER): Payer: BC Managed Care – PPO | Admitting: Sports Medicine

## 2014-05-20 VITALS — BP 120/67 | HR 116 | Wt 180.0 lb

## 2014-05-20 DIAGNOSIS — E291 Testicular hypofunction: Secondary | ICD-10-CM

## 2014-05-20 DIAGNOSIS — E785 Hyperlipidemia, unspecified: Secondary | ICD-10-CM

## 2014-05-20 MED ORDER — TESTOSTERONE CYPIONATE 200 MG/ML IM SOLN: 200.0000 mg | INTRAMUSCULAR | Status: DC

## 2014-05-20 MED ORDER — TESTOSTERONE CYPIONATE 200 MG/ML IM SOLN
200.0000 mg | Freq: Once | INTRAMUSCULAR | Status: AC
  Administered 2014-05-20: 200 mg via INTRAMUSCULAR

## 2014-05-20 MED ORDER — ATORVASTATIN CALCIUM 40 MG PO TABS: 40.0000 mg | ORAL_TABLET | Freq: Every day | ORAL | Status: DC

## 2014-05-20 NOTE — Progress Notes (Signed)
Pt here for testosterone injection no SOB,CP or mood swings. Injection tolerated well given LUOQ .Loralee PacasBarkley, [name] [name]

## 2014-05-20 NOTE — Assessment & Plan Note (Signed)
Testosterone injection as above. 

## 2014-06-04 ENCOUNTER — Ambulatory Visit (INDEPENDENT_AMBULATORY_CARE_PROVIDER_SITE_OTHER): Payer: BC Managed Care – PPO | Admitting: Sports Medicine

## 2014-06-04 VITALS — BP 135/89 | HR 74 | Temp 98.1°F | Ht 70.0 in | Wt 164.0 lb

## 2014-06-04 DIAGNOSIS — E291 Testicular hypofunction: Secondary | ICD-10-CM

## 2014-06-04 MED ORDER — TESTOSTERONE CYPIONATE 100 MG/ML IM SOLN
200.0000 mg | Freq: Once | INTRAMUSCULAR | Status: AC
  Administered 2014-06-04: 200 mg via INTRAMUSCULAR

## 2014-06-04 NOTE — Assessment & Plan Note (Signed)
Testosterone injection as above. 

## 2014-06-04 NOTE — Progress Notes (Signed)
Pt came in today for his testosterone injection. He received 200 mg (1cc) in the LUOQ. Pt tolerated well. Pt denies cp,ha, abnormal bleeding, mood changes, palpitations,or dizziness./Elven Laboy,CMA.

## 2014-06-18 ENCOUNTER — Ambulatory Visit (INDEPENDENT_AMBULATORY_CARE_PROVIDER_SITE_OTHER): Payer: BC Managed Care – PPO | Admitting: Sports Medicine

## 2014-06-18 VITALS — BP 136/87 | HR 99 | Temp 99.3°F | Wt 177.0 lb

## 2014-06-18 DIAGNOSIS — E291 Testicular hypofunction: Secondary | ICD-10-CM

## 2014-06-18 MED ORDER — TESTOSTERONE CYPIONATE 200 MG/ML IM SOLN
200.0000 mg | Freq: Once | INTRAMUSCULAR | Status: AC
  Administered 2014-06-18: 200 mg via INTRAMUSCULAR

## 2014-06-18 NOTE — Progress Notes (Signed)
   Subjective:    Patient ID: Marcus Lee, male    DOB: 1971-09-12, 43 y.o.   MRN: [id number]  HPI    Review of Systems     Objective:   Physical Exam        Assessment & Plan:  Pt came in today for his testosterone injection and tolerated well. It was given RUOQ 200 mg (1 cc). Denies chest pain, HA, palpitations, or mood swings./Lilyann Gravelle,CMA

## 2014-06-19 ENCOUNTER — Other Ambulatory Visit: Payer: Self-pay | Admitting: Sports Medicine

## 2014-06-20 NOTE — Assessment & Plan Note (Signed)
Testosterone injection as above. 

## 2014-07-02 ENCOUNTER — Ambulatory Visit (INDEPENDENT_AMBULATORY_CARE_PROVIDER_SITE_OTHER): Payer: BC Managed Care – PPO | Admitting: Sports Medicine

## 2014-07-02 VITALS — BP 148/89 | HR 118 | Ht 70.0 in | Wt 178.0 lb

## 2014-07-02 DIAGNOSIS — E291 Testicular hypofunction: Secondary | ICD-10-CM

## 2014-07-02 MED ORDER — TESTOSTERONE CYPIONATE 200 MG/ML IM SOLN
200.0000 mg | Freq: Once | INTRAMUSCULAR | Status: AC
  Administered 2014-07-02: 200 mg via INTRAMUSCULAR

## 2014-07-02 NOTE — Progress Notes (Signed)
   Subjective:    Patient ID: Marcus Lee, male    DOB: 06-27-1971, 43 y.o.   MRN: [id number]  HPI Patient presents today for testosterone injection. He received injection in his LUOQ without complication. Patient states that he is unsure if these injections are working he said that he is feeling run down like he was feeling before. Denies CP, SOB or mood swings. Corliss SkainsJamie Neena Beecham, CMA    Review of Systems     Objective:   Physical Exam        Assessment & Plan:

## 2014-07-02 NOTE — Assessment & Plan Note (Signed)
Testosterone injection as above. 

## 2014-07-09 ENCOUNTER — Telehealth: Payer: Self-pay

## 2014-07-09 DIAGNOSIS — E291 Testicular hypofunction: Secondary | ICD-10-CM

## 2014-07-11 LAB — TESTOSTERONE, FREE, TOTAL, SHBG
Sex Hormone Binding: 39 nmol/L (ref 13–71)
Testosterone, Free: 165.4 pg/mL (ref 47.0–244.0)
Testosterone-% Free: 2.1 % (ref 1.6–2.9)
Testosterone: 798 ng/dL (ref 300–890)

## 2014-07-12 NOTE — Telephone Encounter (Signed)
error 

## 2014-07-16 ENCOUNTER — Ambulatory Visit (INDEPENDENT_AMBULATORY_CARE_PROVIDER_SITE_OTHER): Payer: BC Managed Care – PPO | Admitting: Sports Medicine

## 2014-07-16 VITALS — BP 112/63 | HR 95 | Wt 176.0 lb

## 2014-07-16 DIAGNOSIS — E291 Testicular hypofunction: Secondary | ICD-10-CM

## 2014-07-16 MED ORDER — TESTOSTERONE CYPIONATE 200 MG/ML IM SOLN
200.0000 mg | Freq: Once | INTRAMUSCULAR | Status: AC
  Administered 2014-07-16: 200 mg via INTRAMUSCULAR

## 2014-07-16 NOTE — Progress Notes (Signed)
   Subjective:    Patient ID: Marcus Lee, male    DOB: 1971-11-10, 43 y.o.   MRN: [id number]  HPI  Gennaro is here for his testosterone injection. Denies chest pain, shortness of breath, headaches or mood changes.   Review of Systems     Objective:   Physical Exam        Assessment & Plan:  Patient tolerated injection well without any complications.

## 2014-07-16 NOTE — Assessment & Plan Note (Signed)
Testosterone injection as above. 

## 2014-07-30 ENCOUNTER — Ambulatory Visit (INDEPENDENT_AMBULATORY_CARE_PROVIDER_SITE_OTHER): Payer: BC Managed Care – PPO | Admitting: Sports Medicine

## 2014-07-30 VITALS — BP 110/67 | HR 101 | Wt 176.0 lb

## 2014-07-30 DIAGNOSIS — E291 Testicular hypofunction: Secondary | ICD-10-CM

## 2014-07-30 MED ORDER — TESTOSTERONE CYPIONATE 200 MG/ML IM SOLN
200.0000 mg | Freq: Once | INTRAMUSCULAR | Status: AC
  Administered 2014-07-30: 200 mg via INTRAMUSCULAR

## 2014-07-30 NOTE — Progress Notes (Signed)
   Subjective:    Patient ID: Marcus Lee, male    DOB: [birth year], 43 y.o.   MRN: [id number]  HPI  Edan is here for a testosterone injection. Denies chest pain, shortness of breath, headaches and mood changes.  Review of Systems     Objective:   Physical Exam        Assessment & Plan:  Patient tolerated injection well without any complications. Patient advised to schedule his next injection for 2 weeks from today.

## 2014-07-30 NOTE — Assessment & Plan Note (Signed)
Testosterone injection as above. 

## 2014-08-13 ENCOUNTER — Ambulatory Visit (INDEPENDENT_AMBULATORY_CARE_PROVIDER_SITE_OTHER): Payer: BC Managed Care – PPO | Admitting: Sports Medicine

## 2014-08-13 VITALS — BP 129/80 | HR 90 | Ht 70.0 in | Wt 178.0 lb

## 2014-08-13 DIAGNOSIS — E291 Testicular hypofunction: Secondary | ICD-10-CM | POA: Diagnosis not present

## 2014-08-13 MED ORDER — TESTOSTERONE CYPIONATE 200 MG/ML IM SOLN
200.0000 mg | Freq: Once | INTRAMUSCULAR | Status: AC
  Administered 2014-08-13: 200 mg via INTRAMUSCULAR

## 2014-08-13 NOTE — Assessment & Plan Note (Signed)
Testosterone injection as above. 

## 2014-08-13 NOTE — Progress Notes (Signed)
   Subjective:    Patient ID: Marcus Lee, male    DOB: [birth year], 43 y.o.   MRN: [id number]  HPI Marcus Lee report for testosterone injection which he rec'd in his RUOQ without complication. No complaints at this time. Corliss Skains, CMA   Review of Systems     Objective:   Physical Exam        Assessment & Plan:  RTC in 2 weeks for injection.

## 2014-08-27 ENCOUNTER — Ambulatory Visit: Payer: BC Managed Care – PPO

## 2014-08-27 ENCOUNTER — Encounter: Payer: Self-pay | Admitting: Sports Medicine

## 2014-08-27 ENCOUNTER — Ambulatory Visit (INDEPENDENT_AMBULATORY_CARE_PROVIDER_SITE_OTHER): Payer: BC Managed Care – PPO | Admitting: Sports Medicine

## 2014-08-27 VITALS — BP 127/73 | HR 120 | Ht 69.0 in | Wt 123.0 lb

## 2014-08-27 DIAGNOSIS — N528 Other male erectile dysfunction: Secondary | ICD-10-CM | POA: Diagnosis not present

## 2014-08-27 DIAGNOSIS — I10 Essential (primary) hypertension: Secondary | ICD-10-CM

## 2014-08-27 DIAGNOSIS — E291 Testicular hypofunction: Secondary | ICD-10-CM

## 2014-08-27 DIAGNOSIS — F4321 Adjustment disorder with depressed mood: Secondary | ICD-10-CM | POA: Diagnosis not present

## 2014-08-27 DIAGNOSIS — N529 Male erectile dysfunction, unspecified: Secondary | ICD-10-CM | POA: Insufficient documentation

## 2014-08-27 MED ORDER — TADALAFIL 5 MG PO TABS: 5.0000 mg | ORAL_TABLET | Freq: Every day | ORAL | Status: DC | PRN

## 2014-08-27 MED ORDER — ALPRAZOLAM 1 MG PO TABS: 1.0000 mg | ORAL_TABLET | Freq: Every evening | ORAL | Status: DC | PRN

## 2014-08-27 MED ORDER — TESTOSTERONE CYPIONATE 200 MG/ML IM SOLN
200.0000 mg | INTRAMUSCULAR | Status: DC
Start: 2014-08-27 — End: 2014-08-27
  Administered 2014-08-27: 200 mg via INTRAMUSCULAR

## 2014-08-27 MED ORDER — LISINOPRIL-HYDROCHLOROTHIAZIDE 20-25 MG PO TABS: 1.0000 | ORAL_TABLET | Freq: Every day | ORAL | Status: DC

## 2014-08-27 MED ORDER — ATORVASTATIN CALCIUM 40 MG PO TABS: 40.0000 mg | ORAL_TABLET | Freq: Every day | ORAL | Status: DC

## 2014-08-27 NOTE — Assessment & Plan Note (Signed)
Normal grieving response, patient's mother passed away last week. No suicidal or homicidal ideation, simply feels on. Small course of Xanax given.

## 2014-08-27 NOTE — Progress Notes (Signed)
  Subjective:    CC: Erectile dysfunction  HPI: Male hypogonadism : good amounts of energy, muscle mass, unfortunately tells me he is having difficulty with erectile function, on further discussion he understood testosterone supplementation usually does not improve erectile function.  Hypertension: Well controlled, needs a refill.  Hyperlipidemia: Well controlled, needs a refill.  Grief response: Mother died earlier this week, feeling significantly on age, tearful, wonders if anything can be done to help his symptoms. He denies any suicidal or homicidal ideation.  Past medical history, Surgical history, Family history not pertinant except as noted below, Social history, Allergies, and medications have been entered into the medical record, reviewed, and no changes needed.   Review of Systems: No fevers, chills, night sweats, weight loss, chest pain, or shortness of breath.   Objective:    General: Well Developed, well nourished, and in no acute distress. Tearful in discussing his mother's death. Neuro: Alert and oriented x3, extra-ocular muscles intact, sensation grossly intact.  HEENT: Normocephalic, atraumatic, pupils equal round reactive to light, neck supple, no masses, no lymphadenopathy, thyroid nonpalpable.  Skin: Warm and dry, no rashes. Cardiac: Regular rate and rhythm, no murmurs rubs or gallops, no lower extremity edema.  Respiratory: Clear to auscultation bilaterally. Not using accessory muscles, speaking in full sentences.  Impression and Recommendations:

## 2014-08-27 NOTE — Assessment & Plan Note (Signed)
Cialis prescription and discount card given.

## 2014-08-27 NOTE — Assessment & Plan Note (Addendum)
Continue testosterone supplementation, he has developed some erectile dysfunction, testosterone supplementation has not been shown to provide much response for erectile function.

## 2014-09-28 ENCOUNTER — Encounter: Payer: Self-pay | Admitting: Sports Medicine

## 2014-09-28 ENCOUNTER — Ambulatory Visit (INDEPENDENT_AMBULATORY_CARE_PROVIDER_SITE_OTHER): Payer: BC Managed Care – PPO | Admitting: Sports Medicine

## 2014-09-28 DIAGNOSIS — F4321 Adjustment disorder with depressed mood: Secondary | ICD-10-CM | POA: Diagnosis not present

## 2014-09-28 MED ORDER — ALPRAZOLAM 2 MG PO TABS
2.0000 mg | ORAL_TABLET | Freq: Every day | ORAL | Status: DC | PRN
Start: 2014-09-28 — End: 2014-11-01

## 2014-09-28 MED ORDER — ESCITALOPRAM OXALATE 20 MG PO TABS: ORAL_TABLET | ORAL | Status: DC

## 2014-09-28 NOTE — Assessment & Plan Note (Signed)
Worsening of grieving symptoms without suicidal or homicidal ideation. Adding Lexapro 20 mg daily and increasing alprazolam.  He understands that once his grieving and anxiety is more under control we will decrease his alprazolam.  He uses approximately 1 pill per day, I like to see him back in one month.

## 2014-09-28 NOTE — Progress Notes (Signed)
  Subjective:    CC: follow-up  HPI: Grieving: Marcus Lee returns, he is approximate one month from the passing of his mother, we added some alprazolam at the last visit to help him through the funeral planning, he returns today with persistent symptoms of anxiety and depression, significant anhedonia and depressed mood, severe difficulty sleeping, poor energy, decreased appetite, sensations of guilt, difficulty concentrating and psychomotor retardation. He also had significant anxiety, nervousness, uncontrollable worry, trouble relaxing and restlessness. He denies any suicidal or homicidal ideation. Amenable to trying a controller medication for his symptoms with as needed alprazolam for flares.  Past medical history, Surgical history, Family history not pertinant except as noted below, Social history, Allergies, and medications have been entered into the medical record, reviewed, and no changes needed.   Review of Systems: No fevers, chills, night sweats, weight loss, chest pain, or shortness of breath.   Objective:    General: Well Developed, well nourished, and in no acute distress.  Neuro: Alert and oriented x3, extra-ocular muscles intact, sensation grossly intact.  HEENT: Normocephalic, atraumatic, pupils equal round reactive to light, neck supple, no masses, no lymphadenopathy, thyroid nonpalpable.  Skin: Warm and dry, no rashes. Cardiac: Regular rate and rhythm, no murmurs rubs or gallops, no lower extremity edema.  Respiratory: Clear to auscultation bilaterally. Not using accessory muscles, speaking in full sentences.  Impression and Recommendations:

## 2014-10-29 ENCOUNTER — Encounter: Payer: Self-pay | Admitting: *Deleted

## 2014-10-29 ENCOUNTER — Emergency Department (INDEPENDENT_AMBULATORY_CARE_PROVIDER_SITE_OTHER)
Admission: EM | Admit: 2014-10-29 | Discharge: 2014-10-29 | Disposition: A | Discharge status: Home / Self Care | Payer: BC Managed Care – PPO | Source: Home / Self Care | Attending: Family Medicine | Admitting: Family Medicine

## 2014-10-29 DIAGNOSIS — T783XXA Angioneurotic edema, initial encounter: Secondary | ICD-10-CM

## 2014-10-29 HISTORY — DX: Hyperlipidemia, unspecified: E78.5

## 2014-10-29 HISTORY — DX: Essential (primary) hypertension: I10

## 2014-10-29 HISTORY — DX: Depression, unspecified: F32.A

## 2014-10-29 HISTORY — DX: Major depressive disorder, single episode, unspecified: F32.9

## 2014-10-29 MED ORDER — METHYLPREDNISOLONE ACETATE 80 MG/ML IJ SUSP
80.0000 mg | Freq: Once | INTRAMUSCULAR | Status: AC
  Administered 2014-10-29: 80 mg via INTRAMUSCULAR

## 2014-10-29 MED ORDER — PREDNISONE 20 MG PO TABS: 20.0000 mg | ORAL_TABLET | Freq: Two times a day (BID) | ORAL | Status: DC

## 2014-10-29 NOTE — ED Notes (Signed)
Marcus JohnBrian c/o upper lip swelling starting during the night last night. C/o tingling. Denies SOB, chest tightness. Negative for pharyngeal swelling. No new meds taken.

## 2014-10-29 NOTE — Discharge Instructions (Signed)
Discontinue lisinopril/hydrochlorothiazide.  Begin prednisone tomorrow.  May return in 48 hours for blood pressure check.  If symptoms become significantly worse during the night or over the weekend, proceed to the local emergency room.    Angioedema Angioedema is a sudden swelling of tissues, often of the skin. It can occur on the face or genitals or in the abdomen or other body parts. The swelling usually develops over a short period and gets better in 24 to 48 hours. It often begins during the night and is found when the person wakes up. The person may also get red, itchy patches of skin (hives). Angioedema can be dangerous if it involves swelling of the air passages.  Depending on the cause, episodes of angioedema may only happen once, come back in unpredictable patterns, or repeat for several years and then gradually fade away.  CAUSES  Angioedema can be caused by an allergic reaction to various triggers. It can also result from nonallergic causes, including reactions to drugs, immune system disorders, viral infections, or an abnormal gene that is passed to you from your parents (hereditary). For some people with angioedema, the cause is unknown.  Some things that can trigger angioedema include:   Foods.   Medicines, such as ACE inhibitors, ARBs, nonsteroidal anti-inflammatory agents, or estrogen.   Latex.   Animal saliva.   Insect stings.   Dyes used in X-rays.   Mild injury.   Dental work.  Surgery.  Stress.   Sudden changes in temperature.   Exercise. SIGNS AND SYMPTOMS   Swelling of the skin.  Hives. If these are present, there is also intense itching.  Redness in the affected area.   Pain in the affected area.  Swollen lips or tongue.  Breathing problems. This may happen if the air passages swell.  Wheezing. If internal organs are involved, there may be:   Nausea.   Abdominal pain.   Vomiting.   Difficulty swallowing.   Difficulty  passing urine. DIAGNOSIS   Your health care provider will examine the affected area and take a medical and family history.  Various tests may be done to help determine the cause. Tests may include:  Allergy skin tests to see if the problem is an allergic reaction.   Blood tests to check for hereditary angioedema.   Tests to check for underlying diseases that could cause the condition.   A review of your medicines, including over-the-counter medicines, may be done. TREATMENT  Treatment will depend on the cause of the angioedema. Possible treatments include:   Removal of anything that triggered the condition (such as stopping certain medicines).   Medicines to treat symptoms or prevent attacks. Medicines given may include:   Antihistamines.   Epinephrine injection.   Steroids.   Hospitalization may be required for severe attacks. If the air passages are affected, it can be an emergency. Tubes may need to be placed to keep the airway open. HOME CARE INSTRUCTIONS   Take all medicines as directed by your health care provider.  If you were given medicines for emergency allergy treatment, always carry them with you.  Wear a medical bracelet as directed by your health care provider.   Avoid known triggers. SEEK MEDICAL CARE IF:   You have repeat attacks of angioedema.   Your attacks are more frequent or more severe despite preventive measures.   You have hereditary angioedema and are considering having children. It is important to discuss with your health care provider the risks of passing the  condition on to your children. SEEK IMMEDIATE MEDICAL CARE IF:   You have severe swelling of the mouth, tongue, or lips.  You have difficulty breathing.   You have difficulty swallowing.   You faint. MAKE SURE YOU:  Understand these instructions.  Will watch your condition.  Will get help right away if you are not doing well or get worse. Document Released:  01/14/2002 Document Revised: 03/22/2014 Document Reviewed: 06/29/2013 [facility] Patient Information 2015 [employer] [place]. This information is not intended to replace advice given to you by your health care provider. Make sure you discuss any questions you have with your health care provider.

## 2014-10-29 NOTE — ED Provider Notes (Signed)
CSN: [id number]     Arrival date & time 10/29/14  0813 History   First MD Initiated Contact with Patient 10/29/14 [phone]     Chief Complaint  Patient presents with  . Oral Swelling    upper lip   (Consider location/radiation/quality/duration/timing/severity/associated sxs/prior Treatment) HPI Comments: Patient developed mild swelling of his right upper lip at midnight.  Today his entire upper lip was swollen.  He notes some tingling sensation in his upper lip but no pain or warmth.  No swelling in mouth or throat.  No shortness of breath or wheezing.  No chest pain.  No recent med changes or known exposure to allergens.  The history is provided by the patient.    Past Medical History  Diagnosis Date  . Hypertension   . Hyperlipidemia   . Depression    No past surgical history on file. Family History  Problem Relation Age of Onset  . Hypertension Mother   . Hypertension Father    History  Substance Use Topics  . Smoking status: Current Every Day Smoker -- 0.50 packs/day for 25 years    Types: E-cigarettes  . Smokeless tobacco: Never Used  . Alcohol Use: Yes    Review of Systems  Constitutional: Negative for fever, chills, diaphoresis, activity change, appetite change and fatigue.  HENT: Positive for facial swelling. Negative for congestion, ear pain, mouth sores, rhinorrhea, sinus pressure and sore throat.   Eyes: Negative.   Respiratory: Negative for cough, choking, chest tightness, shortness of breath, wheezing and stridor.   Cardiovascular: Negative.   Gastrointestinal: Negative.   Genitourinary: Negative.   Musculoskeletal: Negative.   Skin: Negative.   Neurological: Negative for dizziness, facial asymmetry, speech difficulty, numbness and headaches.    Allergies  Penicillins  Home Medications   Prior to Admission medications   Medication Sig Start Date End Date Taking? Authorizing Provider  alprazolam Prudy Feeler(XANAX) 2 MG tablet Take 1 tablet (2 mg total) by mouth  daily as needed for anxiety. 09/28/14   Monica Bectonhomas J Thekkekandam, MD  atorvastatin (LIPITOR) 40 MG tablet Take 1 tablet (40 mg total) by mouth daily at 6 PM. 08/27/14   Monica Bectonhomas J Thekkekandam, MD  escitalopram (LEXAPRO) 20 MG tablet One half tab daily for a week then one tab by mouth daily 09/28/14   Monica Bectonhomas J Thekkekandam, MD  lisinopril-hydrochlorothiazide (PRINZIDE,ZESTORETIC) 20-25 MG per tablet Take 1 tablet by mouth daily. 08/27/14   Monica Bectonhomas J Thekkekandam, MD  predniSONE (DELTASONE) 20 MG tablet Take 1 tablet (20 mg total) by mouth 2 (two) times daily. Take with food. 10/29/14   Lattie HawStephen A Beese, MD  tadalafil (CIALIS) 5 MG tablet Take 1 tablet (5 mg total) by mouth daily as needed for erectile dysfunction. 08/27/14   Monica Bectonhomas J Thekkekandam, MD  testosterone cypionate (DEPOTESTOTERONE CYPIONATE) 200 MG/ML injection Inject 200 mg into the muscle every 14 (fourteen) days.    Historical Provider, MD   BP 111/77 mmHg  Pulse 113  Temp(Src) 98.4 F (36.9 C) (Oral)  Resp 14  Wt 167 lb (75.751 kg)  SpO2 97% Physical Exam  Constitutional: He is oriented to person, place, and time. He appears well-developed and well-nourished. No distress.  HENT:  Head: Atraumatic.  Right Ear: External ear normal.  Left Ear: External ear normal.  Nose: Nose normal.  Mouth/Throat: Oropharynx is clear and moist.    There is diffuse nontender swelling of the upper lip extending to beneath nose.  Mouth and pharynx otherwise normal.  Eyes: Conjunctivae are normal. Pupils  are equal, round, and reactive to light. Right eye exhibits no discharge. Left eye exhibits no discharge.  Neck: Neck supple.  Cardiovascular: Normal heart sounds.   Pulmonary/Chest: Breath sounds normal.  Abdominal: There is no tenderness.  Musculoskeletal: He exhibits no edema.  Lymphadenopathy:    He has no cervical adenopathy.  Neurological: He is alert and oriented to person, place, and time.  Skin: Skin is warm and dry. No rash noted.  Nursing  note and vitals reviewed.   ED Course  Procedures  none     MDM   1. Angioedema of lips, initial encounter; presumably caused by lisinopril    DepoMedrol 80mg ; begin prednisone burst tomorrow. Discontinue lisinopril/hydrochlorothiazide.  Begin prednisone tomorrow.  May return in 48 hours for blood pressure check.  If symptoms become significantly worse during the night or over the weekend, proceed to the local emergency room.  Followup with Dr. Rodney Langtonhomas Thekkekandam in three days as scheduled.     Lattie HawStephen A Beese, MD 11/02/14 [phone]

## 2014-11-01 ENCOUNTER — Encounter: Payer: Self-pay | Admitting: Sports Medicine

## 2014-11-01 ENCOUNTER — Ambulatory Visit (INDEPENDENT_AMBULATORY_CARE_PROVIDER_SITE_OTHER): Payer: BC Managed Care – PPO | Admitting: Sports Medicine

## 2014-11-01 DIAGNOSIS — N528 Other male erectile dysfunction: Secondary | ICD-10-CM | POA: Diagnosis not present

## 2014-11-01 DIAGNOSIS — I809 Phlebitis and thrombophlebitis of unspecified site: Secondary | ICD-10-CM | POA: Insufficient documentation

## 2014-11-01 DIAGNOSIS — I10 Essential (primary) hypertension: Secondary | ICD-10-CM | POA: Diagnosis not present

## 2014-11-01 DIAGNOSIS — F4321 Adjustment disorder with depressed mood: Secondary | ICD-10-CM

## 2014-11-01 MED ORDER — VALSARTAN-HYDROCHLOROTHIAZIDE 160-12.5 MG PO TABS: 1.0000 | ORAL_TABLET | Freq: Every day | ORAL | Status: DC

## 2014-11-01 MED ORDER — SILDENAFIL CITRATE 20 MG PO TABS: 20.0000 mg | ORAL_TABLET | ORAL | Status: DC | PRN

## 2014-11-01 MED ORDER — ARIPIPRAZOLE 5 MG PO TABS: 5.0000 mg | ORAL_TABLET | Freq: Every day | ORAL | Status: DC

## 2014-11-01 MED ORDER — ALPRAZOLAM 2 MG PO TABS
2.0000 mg | ORAL_TABLET | Freq: Every day | ORAL | Status: DC | PRN
Start: 2014-11-01 — End: 2014-12-03

## 2014-11-01 NOTE — Assessment & Plan Note (Signed)
Recent episode of angioedema on lisinopril/Hydrocort thiazide. Switching to valsartan/hydrochlorothiazide

## 2014-11-01 NOTE — Patient Instructions (Signed)
[facility]  Address: [address], [place], [place] [id number]

## 2014-11-01 NOTE — Assessment & Plan Note (Signed)
Palpable nodule with a tender palpable cord in the vein distally on the left leg, medial calf. Negative Homans sign, I do not suspect that this is a deep vein thrombosis however this does appear to be a superficial thrombophlebitis. Over-the-counter NSAIDs, compression stockings, ultrasound today to ensure no element of DVT.  

## 2014-11-01 NOTE — Assessment & Plan Note (Signed)
Adding low-cost sildenafil.

## 2014-11-01 NOTE — Progress Notes (Addendum)
  Subjective:    CC: Follow-up grieving  HPI: Marcus Lee returns, his mother passed away about 2 months ago, he had significant grieving and tearfulness so we started Lexapro and Xanax. Overall he feels significantly better with regards to his mood but continues to have severe swings. He does need a refill on Xanax.  Erectile dysfunction: Needs a cheaper option other than Cialis.  Hypertension: Allergic reaction to lisinopril, would like to switch to something else. No headaches, visual changes, chest pain.  Past medical history, Surgical history, Family history not pertinant except as noted below, Social history, Allergies, and medications have been entered into the medical record, reviewed, and no changes needed.   Review of Systems: No fevers, chills, night sweats, weight loss, chest pain, or shortness of breath.   Objective:    General: Well Developed, well nourished, and in no acute distress.  Neuro: Alert and oriented x3, extra-ocular muscles intact, sensation grossly intact.  HEENT: Normocephalic, atraumatic, pupils equal round reactive to light, neck supple, no masses, no lymphadenopathy, thyroid nonpalpable.  Skin: Warm and dry, no rashes. Cardiac: Regular rate and rhythm, no murmurs rubs or gallops, no lower extremity edema.  Respiratory: Clear to auscultation bilaterally. Not using accessory muscles, speaking in full sentences.  Impression and Recommendations:

## 2014-11-01 NOTE — Assessment & Plan Note (Signed)
Doing better with Xanax and Lexapro. His mood is better but he still gets significant swings so we are going to add Abilify. Return in one month. Refilling Xanax.

## 2014-11-05 ENCOUNTER — Ambulatory Visit (INDEPENDENT_AMBULATORY_CARE_PROVIDER_SITE_OTHER): Payer: BC Managed Care – PPO | Admitting: Sports Medicine

## 2014-11-05 VITALS — BP 106/65 | HR 79 | Wt 174.0 lb

## 2014-11-05 DIAGNOSIS — E291 Testicular hypofunction: Secondary | ICD-10-CM | POA: Diagnosis not present

## 2014-11-05 MED ORDER — TESTOSTERONE CYPIONATE 200 MG/ML IM SOLN
200.0000 mg | Freq: Once | INTRAMUSCULAR | Status: AC
  Administered 2014-11-05: 200 mg via INTRAMUSCULAR

## 2014-11-05 NOTE — Assessment & Plan Note (Signed)
Testosterone injection as above. 

## 2014-11-05 NOTE — Progress Notes (Signed)
   Subjective:    Patient ID: Marcus Lee, male    DOB: 1971/09/05, 43 y.o.   MRN: [id number]  HPI  Marcus Lee is here for a testosterone injection. Denies chest pain, shortness of breath, headaches or mood changes.   Review of Systems     Objective:   Physical Exam        Assessment & Plan:  Patient tolerated injection well without complications. Patient advised to schedule next injection 14 days from today.

## 2014-11-22 ENCOUNTER — Other Ambulatory Visit: Payer: Self-pay | Admitting: Sports Medicine

## 2014-11-24 ENCOUNTER — Other Ambulatory Visit: Payer: Self-pay | Admitting: Sports Medicine

## 2014-11-26 ENCOUNTER — Other Ambulatory Visit: Payer: Self-pay | Admitting: Sports Medicine

## 2014-11-26 ENCOUNTER — Ambulatory Visit (INDEPENDENT_AMBULATORY_CARE_PROVIDER_SITE_OTHER): Payer: BLUE CROSS/BLUE SHIELD | Admitting: Sports Medicine

## 2014-11-26 VITALS — BP 101/63 | HR 89

## 2014-11-26 DIAGNOSIS — E291 Testicular hypofunction: Secondary | ICD-10-CM | POA: Diagnosis not present

## 2014-11-26 MED ORDER — TESTOSTERONE CYPIONATE 200 MG/ML IM SOLN
200.0000 mg | Freq: Once | INTRAMUSCULAR | Status: AC
  Administered 2014-11-26: 200 mg via INTRAMUSCULAR

## 2014-11-26 NOTE — Progress Notes (Signed)
   Subjective:    Patient ID: Marcus Lee, male    DOB: 05/28/1971, 43 y.o.   MRN: [id number]  HPI  Marcus Lee is here for a testosterone injection. Denies chest pain, shortness of breath, headaches or mood changes.   Review of Systems     Objective:   Physical Exam        Assessment & Plan:  Patient tolerated injection well without complications. Patient advised to schedule next injection 14 days from today.  

## 2014-11-26 NOTE — Assessment & Plan Note (Signed)
Testosterone injection as above. 

## 2014-12-03 ENCOUNTER — Encounter: Payer: Self-pay | Admitting: Sports Medicine

## 2014-12-03 ENCOUNTER — Ambulatory Visit (INDEPENDENT_AMBULATORY_CARE_PROVIDER_SITE_OTHER): Payer: BLUE CROSS/BLUE SHIELD | Admitting: Sports Medicine

## 2014-12-03 VITALS — BP 100/65 | HR 92 | Ht 69.0 in | Wt 179.0 lb

## 2014-12-03 DIAGNOSIS — F4321 Adjustment disorder with depressed mood: Secondary | ICD-10-CM

## 2014-12-03 DIAGNOSIS — I10 Essential (primary) hypertension: Secondary | ICD-10-CM | POA: Diagnosis not present

## 2014-12-03 DIAGNOSIS — E785 Hyperlipidemia, unspecified: Secondary | ICD-10-CM | POA: Diagnosis not present

## 2014-12-03 MED ORDER — ALPRAZOLAM 2 MG PO TABS: 2.0000 mg | ORAL_TABLET | Freq: Every day | ORAL | Status: DC | PRN

## 2014-12-03 NOTE — Assessment & Plan Note (Signed)
Extremely well controlled on valsartan/hydrochlorothiazide. No further episodes of angioedema

## 2014-12-03 NOTE — Assessment & Plan Note (Signed)
Greatly improved with Lexapro and Abilify, single additional refill of alprazolam for use up to 1 pill per day.

## 2014-12-03 NOTE — Progress Notes (Signed)
  Subjective:    CC: Follow-up  HPI: Hypertension: Well controlled with the switch to valsartan/hydrochlorothiazide, he did develop angioedema on an ACE inhibitor.  Hyperlipidemia: stable.  Grieving: Significant tearfulness, after his mother passed away to the point where he was unable to function, we added Lexapro, subsequently Abilify, he had Xanax for use as needed. He is feeling significantly better today, he would like to stop the Xanax but continue Abilify and Lexapro. No suicidal or homicidal ideation.  Past medical history, Surgical history, Family history not pertinant except as noted below, Social history, Allergies, and medications have been entered into the medical record, reviewed, and no changes needed.   Review of Systems: No fevers, chills, night sweats, weight loss, chest pain, or shortness of breath.   Objective:    General: Well Developed, well nourished, and in no acute distress.  Neuro: Alert and oriented x3, extra-ocular muscles intact, sensation grossly intact.  HEENT: Normocephalic, atraumatic, pupils equal round reactive to light, neck supple, no masses, no lymphadenopathy, thyroid nonpalpable.  Skin: Warm and dry, no rashes. Cardiac: Regular rate and rhythm, no murmurs rubs or gallops, no lower extremity edema.  Respiratory: Clear to auscultation bilaterally. Not using accessory muscles, speaking in full sentences.  Impression and Recommendations:

## 2014-12-03 NOTE — Assessment & Plan Note (Signed)
Stable on Lipitor, no problems, no side effects from the medication.

## 2014-12-10 ENCOUNTER — Ambulatory Visit: Payer: BLUE CROSS/BLUE SHIELD

## 2014-12-23 ENCOUNTER — Other Ambulatory Visit: Payer: Self-pay | Admitting: Sports Medicine

## 2015-02-17 ENCOUNTER — Other Ambulatory Visit: Payer: Self-pay | Admitting: Sports Medicine

## 2015-02-26 ENCOUNTER — Other Ambulatory Visit: Payer: Self-pay | Admitting: Sports Medicine

## 2015-03-25 ENCOUNTER — Other Ambulatory Visit: Payer: Self-pay | Admitting: Sports Medicine

## 2015-03-25 ENCOUNTER — Telehealth: Payer: Self-pay

## 2015-03-25 MED ORDER — ATORVASTATIN CALCIUM 40 MG PO TABS: ORAL_TABLET | ORAL | Status: DC

## 2015-03-25 NOTE — Telephone Encounter (Signed)
REFILL REQUEST FOR aTORVASTATIN 40 MG #90 2R WAS SENT ELECTRONICALLY TO [facility]. Bjorn Loserhonda Cunningham,CMA

## 2015-05-06 ENCOUNTER — Ambulatory Visit (INDEPENDENT_AMBULATORY_CARE_PROVIDER_SITE_OTHER): Payer: BLUE CROSS/BLUE SHIELD | Admitting: Sports Medicine

## 2015-05-06 VITALS — BP 127/84 | HR 76 | Wt 176.0 lb

## 2015-05-06 DIAGNOSIS — Z23 Encounter for immunization: Secondary | ICD-10-CM

## 2015-05-06 DIAGNOSIS — Z299 Encounter for prophylactic measures, unspecified: Secondary | ICD-10-CM

## 2015-05-06 NOTE — Progress Notes (Signed)
Patient came into clinic today for injections that are required for him to get his green card. Pt is from [place] and did not receive these injections there. Per the information packet, Pt is to receive: Hep B, Hib, MMR, IPV, and Tdap. Per Dr. Madilyn Fireman it was OK to administer all of these injections at the same time. Verified Pt's allergies, none interfere with injections. No cold symptoms/fever/sickness in past month. Pt elected to have MMR and Tdap in Left deltoid then Hep B, Hib, and IPV in his right deltoid. There were no immediate post injection complications. Injections were added to immunization record and printed so when Pt goes to see someone at the [employer] office on Tuesday he will have record of beginning his series. Pt also was given immunization information statements for all injections given to schedule appropriate follow ups. There were no further questions at this time.

## 2015-05-06 NOTE — Assessment & Plan Note (Signed)
Vaccinations as above 

## 2015-05-09 ENCOUNTER — Ambulatory Visit: Payer: BLUE CROSS/BLUE SHIELD

## 2015-09-02 ENCOUNTER — Other Ambulatory Visit: Payer: Self-pay | Admitting: Sports Medicine

## 2015-12-06 ENCOUNTER — Ambulatory Visit (INDEPENDENT_AMBULATORY_CARE_PROVIDER_SITE_OTHER): Payer: BLUE CROSS/BLUE SHIELD | Admitting: Sports Medicine

## 2015-12-06 VITALS — BP 133/78 | HR 94 | Temp 98.1°F | Resp 18 | Wt 183.1 lb

## 2015-12-06 DIAGNOSIS — N528 Other male erectile dysfunction: Secondary | ICD-10-CM

## 2015-12-06 DIAGNOSIS — I10 Essential (primary) hypertension: Secondary | ICD-10-CM

## 2015-12-06 DIAGNOSIS — Z720 Tobacco use: Secondary | ICD-10-CM

## 2015-12-06 DIAGNOSIS — F17200 Nicotine dependence, unspecified, uncomplicated: Secondary | ICD-10-CM

## 2015-12-06 DIAGNOSIS — E785 Hyperlipidemia, unspecified: Secondary | ICD-10-CM

## 2015-12-06 MED ORDER — SILDENAFIL CITRATE 20 MG PO TABS: 20.0000 mg | ORAL_TABLET | ORAL | Status: AC | PRN

## 2015-12-06 MED ORDER — SILDENAFIL CITRATE 20 MG PO TABS: 20.0000 mg | ORAL_TABLET | ORAL | Status: DC | PRN

## 2015-12-06 MED ORDER — ATORVASTATIN CALCIUM 40 MG PO TABS: 40.0000 mg | ORAL_TABLET | Freq: Every day | ORAL | Status: AC

## 2015-12-06 MED ORDER — VALSARTAN-HYDROCHLOROTHIAZIDE 160-12.5 MG PO TABS: 1.0000 | ORAL_TABLET | Freq: Every day | ORAL | Status: AC

## 2015-12-06 NOTE — Assessment & Plan Note (Signed)
refilling cholesterol medication

## 2015-12-06 NOTE — Assessment & Plan Note (Signed)
Refilling medication

## 2015-12-06 NOTE — Assessment & Plan Note (Signed)
Discussed cessation, precontemplative 

## 2015-12-06 NOTE — Progress Notes (Signed)
  Subjective:    CC: Follow-up  HPI: Hypertension: Desires to restart medication  Smoker: Not yet ready to quit, smokes 10-15 cigarettes per day  Hyperlipidemia: Needs a refill on Lipitor  Mood disorder: Resolved, no need for Xanax, Lexapro, or Abilify. Happy with no depressed mood or anxiety.  Erectile dysfunction: Was well controlled on generic Viagra, needs a refill  Past medical history, Surgical history, Family history not pertinant except as noted below, Social history, Allergies, and medications have been entered into the medical record, reviewed, and no changes needed.   Review of Systems: No fevers, chills, night sweats, weight loss, chest pain, or shortness of breath.   Objective:    General: Well Developed, well nourished, and in no acute distress.  Neuro: Alert and oriented x3, extra-ocular muscles intact, sensation grossly intact.  HEENT: Normocephalic, atraumatic, pupils equal round reactive to light, neck supple, no masses, no lymphadenopathy, thyroid nonpalpable.  Skin: Warm and dry, no rashes. Cardiac: Regular rate and rhythm, no murmurs rubs or gallops, no lower extremity edema.  Respiratory: Clear to auscultation bilaterally. Not using accessory muscles, speaking in full sentences.  Impression and Recommendations:

## 2016-04-13 ENCOUNTER — Ambulatory Visit (INDEPENDENT_AMBULATORY_CARE_PROVIDER_SITE_OTHER): Payer: BLUE CROSS/BLUE SHIELD

## 2016-04-13 ENCOUNTER — Ambulatory Visit (INDEPENDENT_AMBULATORY_CARE_PROVIDER_SITE_OTHER): Payer: BLUE CROSS/BLUE SHIELD | Admitting: Sports Medicine

## 2016-04-13 VITALS — BP 110/74 | HR 94 | Resp 18 | Wt 181.2 lb

## 2016-04-13 DIAGNOSIS — M25511 Pain in right shoulder: Secondary | ICD-10-CM | POA: Insufficient documentation

## 2016-04-13 DIAGNOSIS — F39 Unspecified mood [affective] disorder: Secondary | ICD-10-CM | POA: Diagnosis not present

## 2016-04-13 MED ORDER — ESCITALOPRAM OXALATE 20 MG PO TABS
ORAL_TABLET | ORAL | Status: DC
Start: 2016-04-13 — End: 2016-05-11

## 2016-04-13 NOTE — Progress Notes (Signed)
  Subjective:    CC: Right shoulder pain  HPI: For one month now this pleasant 45 year old male has had pain that he localizes over the top of his right shoulder, worse with reaching across his body. Pain is moderate, persistent without radiation. Denies any trauma, has had some recent overuse.    Past medical history, Surgical history, Family history not pertinant except as noted below, Social history, Allergies, and medications have been entered into the medical record, reviewed, and no changes needed.   Review of Systems: No fevers, chills, night sweats, weight loss, chest pain, or shortness of breath.   Objective:    General: Well Developed, well nourished, and in no acute distress.  Neuro: Alert and oriented x3, extra-ocular muscles intact, sensation grossly intact.  HEENT: Normocephalic, atraumatic, pupils equal round reactive to light, neck supple, no masses, no lymphadenopathy, thyroid nonpalpable.  Skin: Warm and dry, no rashes. Cardiac: Regular rate and rhythm, no murmurs rubs or gallops, no lower extremity edema.  Respiratory: Clear to auscultation bilaterally. Not using accessory muscles, speaking in full sentences. Right Shoulder: Inspection reveals no abnormalities, atrophy or asymmetry. Tenderness over the acromioclavicular joint, positive cross arm sign. ROM is full in all planes. Rotator cuff strength normal throughout. No signs of impingement with negative Neer and Hawkin's tests, empty can. Speeds and Yergason's tests normal. No labral pathology noted with negative Obrien's, negative crank, negative clunk, and good stability. Normal scapular function observed. No painful arc and no drop arm sign. No apprehension sign  Procedure: Real-time Ultrasound Guided Injection of right acromioclavicular joint Device: GE Logiq E  Verbal informed consent obtained.  Time-out conducted.  Noted no overlying erythema, induration, or other signs of local infection.  Skin prepped  in a sterile fashion.  Local anesthesia: Topical Ethyl chloride.  With sterile technique and under real time ultrasound guidance:  1/2 mL lidocaine, 1/2 mL Marcaine, 1/2 mL kenalog 40 injected easily. Completed without difficulty  Pain immediately resolved suggesting accurate placement of the medication.  Advised to call if fevers/chills, erythema, induration, drainage, or persistent bleeding.  Images permanently stored and available for review in the ultrasound unit.  Impression: Technically successful ultrasound guided injection.  Impression and Recommendations:

## 2016-04-13 NOTE — Addendum Note (Signed)
Addended by: Baird KayUGLAS, Sabreen Kitchen M on: 04/13/2016 12:02 PM   Modules accepted: Medications

## 2016-04-13 NOTE — Assessment & Plan Note (Signed)
Restarting Lexapro. Patient tells me his children have noticed change in his personality.

## 2016-04-13 NOTE — Assessment & Plan Note (Signed)
Injection, x-rays, return in one month

## 2016-05-11 ENCOUNTER — Ambulatory Visit (INDEPENDENT_AMBULATORY_CARE_PROVIDER_SITE_OTHER): Payer: BLUE CROSS/BLUE SHIELD | Admitting: Sports Medicine

## 2016-05-11 VITALS — BP 117/70 | HR 89 | Resp 18 | Wt 179.3 lb

## 2016-05-11 DIAGNOSIS — M25511 Pain in right shoulder: Secondary | ICD-10-CM | POA: Diagnosis not present

## 2016-05-11 DIAGNOSIS — I10 Essential (primary) hypertension: Secondary | ICD-10-CM

## 2016-05-11 DIAGNOSIS — F39 Unspecified mood [affective] disorder: Secondary | ICD-10-CM | POA: Diagnosis not present

## 2016-05-11 MED ORDER — ESCITALOPRAM OXALATE 20 MG PO TABS: 20.0000 mg | ORAL_TABLET | Freq: Every day | ORAL | Status: AC

## 2016-05-11 NOTE — Assessment & Plan Note (Signed)
Well controlled, no changes 

## 2016-05-11 NOTE — Progress Notes (Signed)
  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  Depression: Completely resolved with restarting his Lexapro.  Past medical history, Surgical history, Family history not pertinant except as noted below, Social history, Allergies, and medications have been entered into the medical record, reviewed, and no changes needed.   Review of Systems: No fevers, chills, night sweats, weight loss, chest pain, or shortness of breath.   Objective:    General: Well Developed, well nourished, and in no acute distress.  Neuro: Alert and oriented x3, extra-ocular muscles intact, sensation grossly intact.  HEENT: Normocephalic, atraumatic, pupils equal round reactive to light, neck supple, no masses, no lymphadenopathy, thyroid nonpalpable.  Skin: Warm and dry, no rashes. Cardiac: Regular rate and rhythm, no murmurs rubs or gallops, no lower extremity edema.  Respiratory: Clear to auscultation bilaterally. Not using accessory muscles, speaking in full sentences.  Impression and Recommendations:

## 2016-05-11 NOTE — Assessment & Plan Note (Signed)
100% pain relief after right acromioclavicular joint injection.

## 2016-05-11 NOTE — Assessment & Plan Note (Signed)
Well controlled 

## 2016-11-10 IMAGING — DX DG SHOULDER 2+V*R*
3 series · 3 of 3 positions shown · non-contrast
Comparison: None.

CLINICAL DATA: Right shoulder pain for 2 weeks, no known injury,
initial encounter

EXAM:
RIGHT SHOULDER - 2+ VIEW

[shoulder grashey]
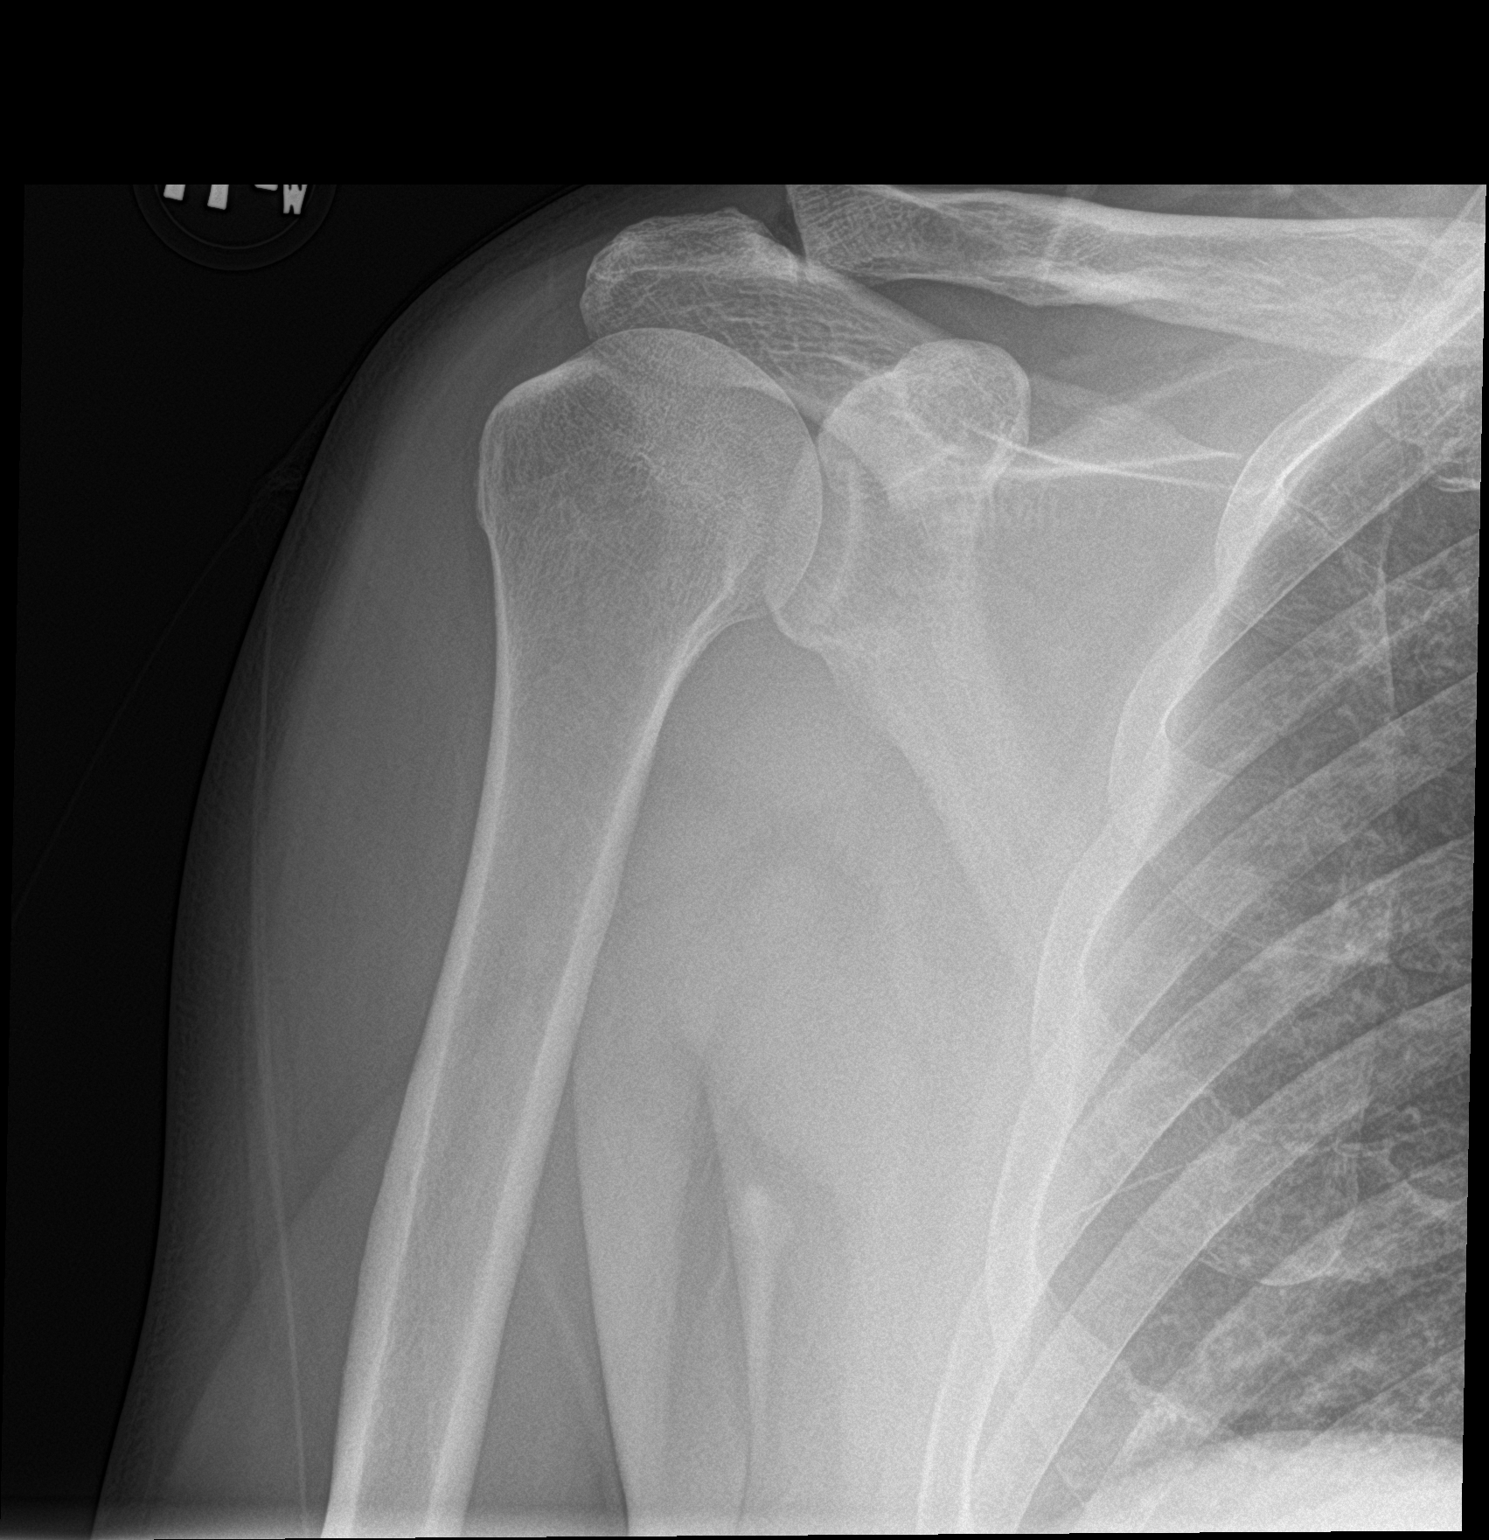

[shoulder y view]
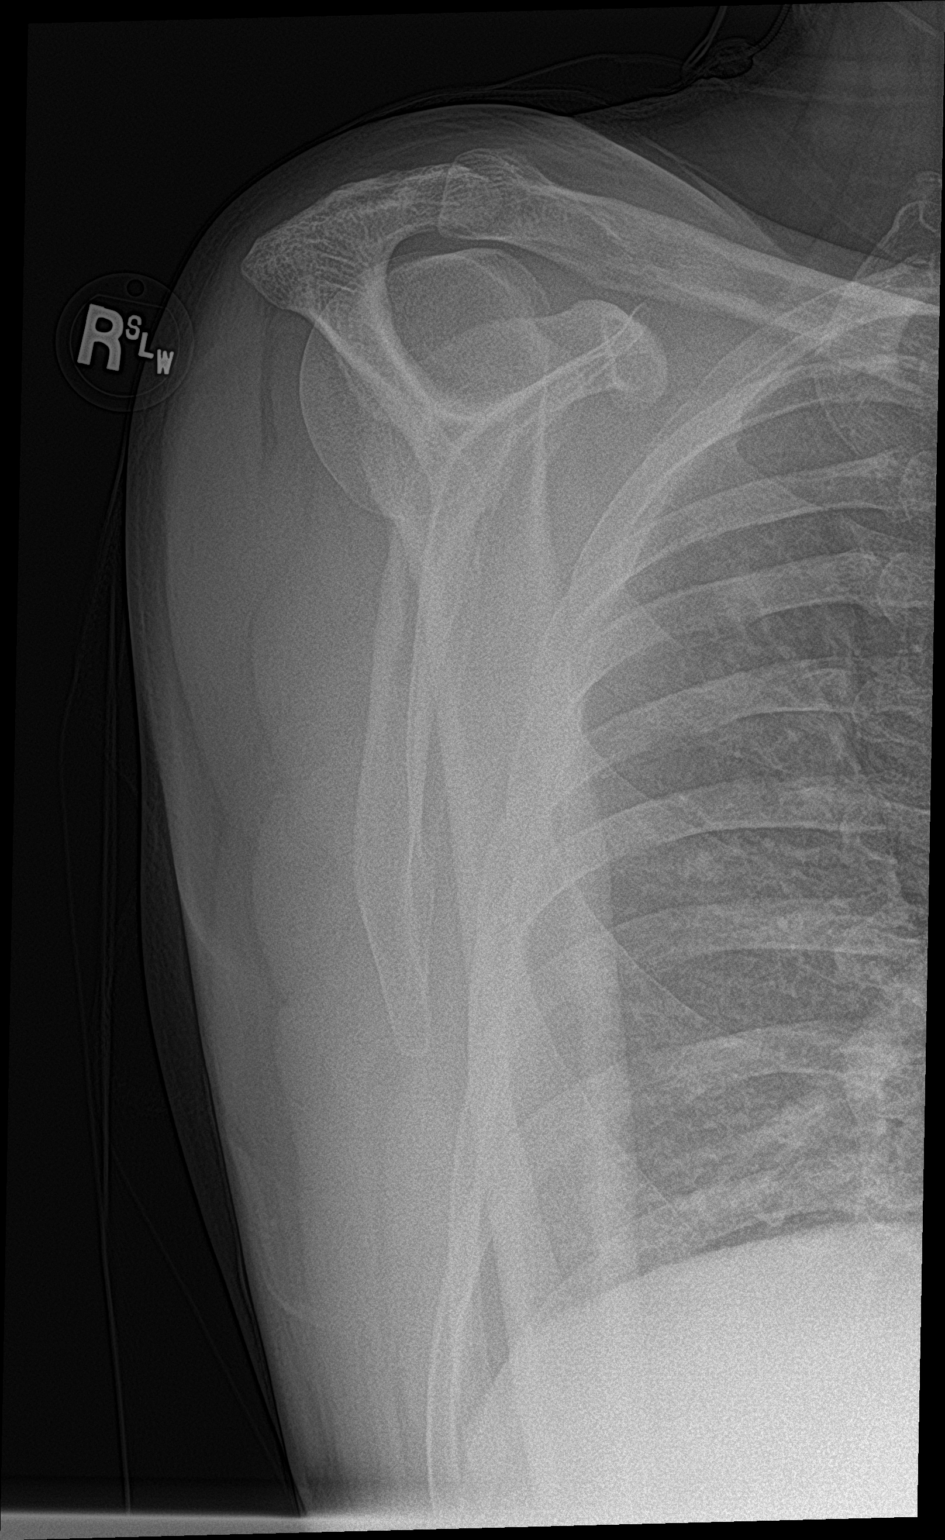

[shoulder axillary]
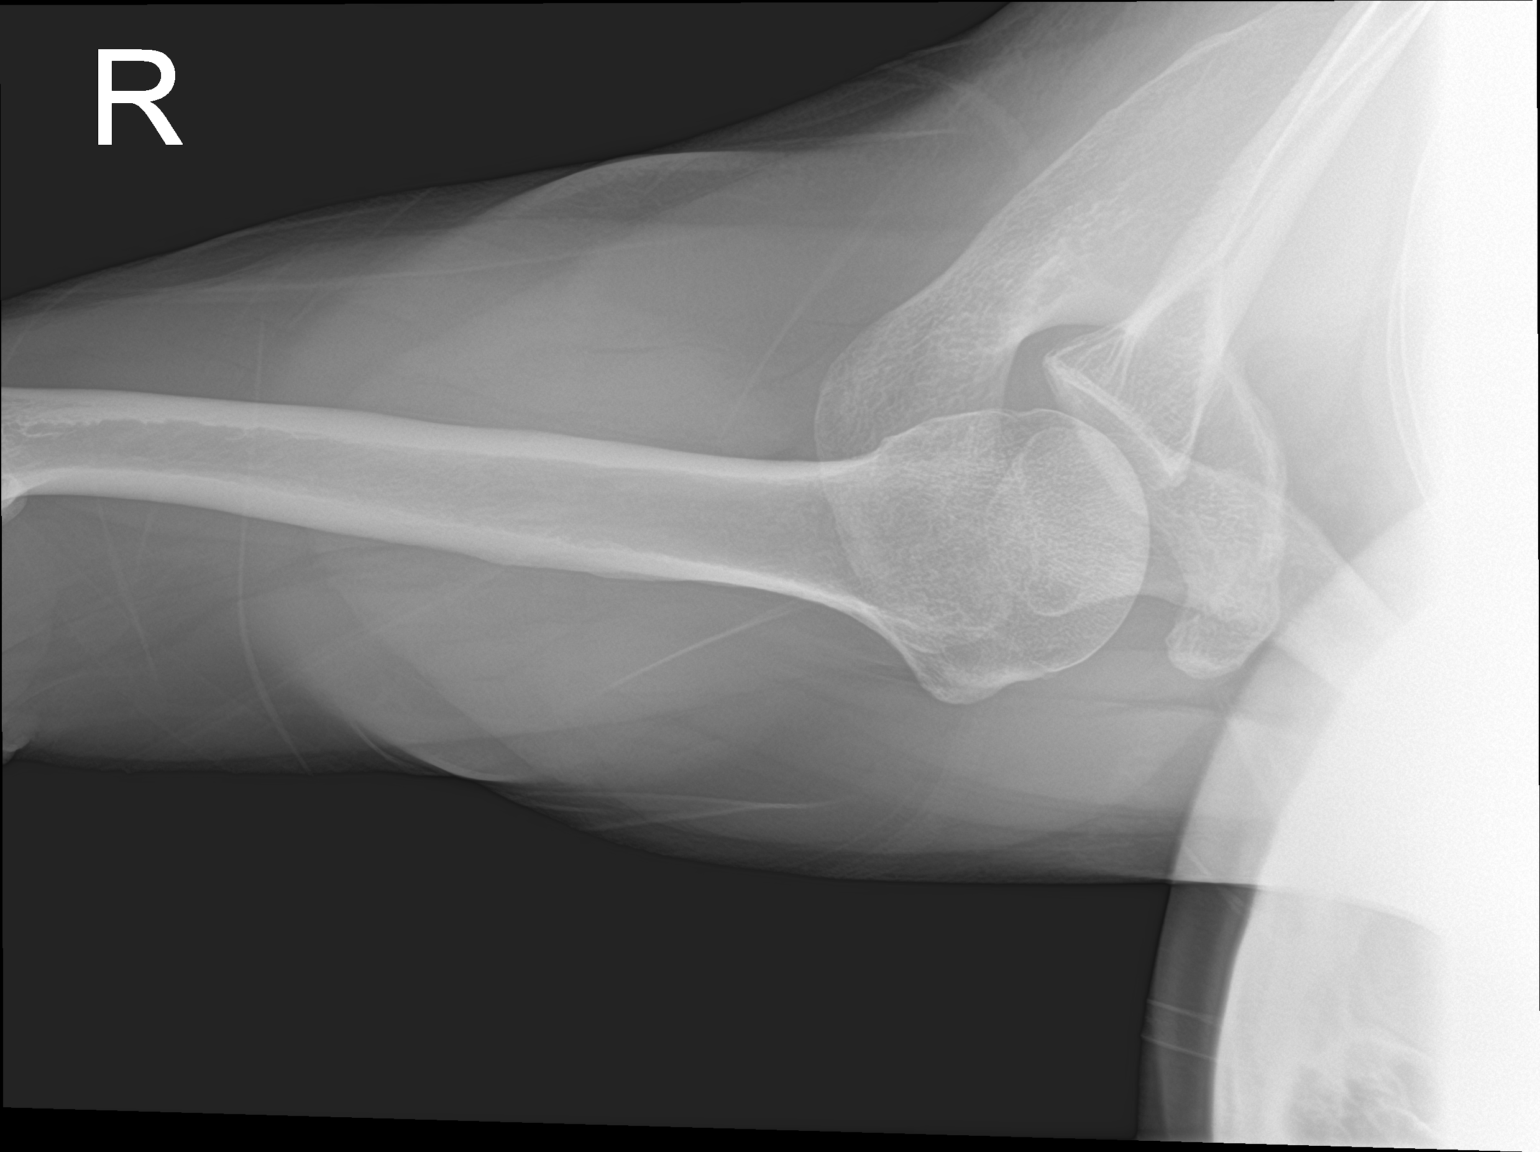

[3 of 3 positions shown; findings below may reference images not displayed]

FINDINGS: Mild degenerative changes of the acromioclavicular joint are seen.
No acute fracture or dislocation is noted. The underlying bony
thorax is within normal limits.
IMPRESSION: No acute abnormality noted.
